# Patient Record
Sex: Male | Born: 1970 | Race: White | Hispanic: No | Marital: Married | Smoking: Former smoker
Health system: Southern US, Community
[De-identification: ages and names within clinical notes are randomized; demographics above are authoritative.]

## PROBLEM LIST (undated history)

## (undated) DIAGNOSIS — R41840 Attention and concentration deficit: Secondary | ICD-10-CM

## (undated) DIAGNOSIS — F32A Depression, unspecified: Secondary | ICD-10-CM

## (undated) DIAGNOSIS — J45909 Unspecified asthma, uncomplicated: Secondary | ICD-10-CM

## (undated) DIAGNOSIS — K219 Gastro-esophageal reflux disease without esophagitis: Secondary | ICD-10-CM

## (undated) DIAGNOSIS — I10 Essential (primary) hypertension: Secondary | ICD-10-CM

## (undated) DIAGNOSIS — I219 Acute myocardial infarction, unspecified: Secondary | ICD-10-CM

## (undated) DIAGNOSIS — J439 Emphysema, unspecified: Secondary | ICD-10-CM

## (undated) DIAGNOSIS — I2510 Atherosclerotic heart disease of native coronary artery without angina pectoris: Secondary | ICD-10-CM

## (undated) DIAGNOSIS — F419 Anxiety disorder, unspecified: Secondary | ICD-10-CM

## (undated) DIAGNOSIS — T7840XA Allergy, unspecified, initial encounter: Secondary | ICD-10-CM

## (undated) DIAGNOSIS — E785 Hyperlipidemia, unspecified: Secondary | ICD-10-CM

## (undated) DIAGNOSIS — F1020 Alcohol dependence, uncomplicated: Secondary | ICD-10-CM

## (undated) DIAGNOSIS — I509 Heart failure, unspecified: Secondary | ICD-10-CM

## (undated) HISTORY — DX: Gastro-esophageal reflux disease without esophagitis: K21.9

## (undated) HISTORY — DX: Alcohol dependence, uncomplicated: F10.20

## (undated) HISTORY — DX: Acute myocardial infarction, unspecified: I21.9

## (undated) HISTORY — DX: Allergy, unspecified, initial encounter: T78.40XA

## (undated) HISTORY — DX: Anxiety disorder, unspecified: F41.9

## (undated) HISTORY — DX: Heart failure, unspecified: I50.9

## (undated) HISTORY — PX: CARDIAC CATHETERIZATION: SHX172

## (undated) HISTORY — DX: Emphysema, unspecified: J43.9

## (undated) HISTORY — DX: Attention and concentration deficit: R41.840

## (undated) HISTORY — DX: Depression, unspecified: F32.A

## (undated) HISTORY — PX: REPLANTATION THUMB: SUR1233

## (undated) HISTORY — DX: Unspecified asthma, uncomplicated: J45.909

## (undated) HISTORY — PX: WISDOM TOOTH EXTRACTION: SHX21

## (undated) SURGERY — Surgical Case
Anesthesia: *Unknown

---

## 2007-01-21 ENCOUNTER — Emergency Department (HOSPITAL_COMMUNITY): Admission: EM | Admit: 2007-01-21 | Discharge: 2007-01-21 | Payer: Self-pay | Admitting: Emergency Medicine

## 2010-09-23 HISTORY — PX: CORONARY STENT PLACEMENT: SHX1402

## 2010-10-11 ENCOUNTER — Inpatient Hospital Stay (HOSPITAL_COMMUNITY)
Admission: EM | Admit: 2010-10-11 | Discharge: 2010-10-14 | DRG: 249 | Disposition: A | Discharge status: Home / Self Care | Payer: 59 | Attending: Cardiovascular Disease | Admitting: Cardiovascular Disease

## 2010-10-11 ENCOUNTER — Emergency Department (HOSPITAL_COMMUNITY): Payer: 59

## 2010-10-11 DIAGNOSIS — E785 Hyperlipidemia, unspecified: Secondary | ICD-10-CM | POA: Diagnosis present

## 2010-10-11 DIAGNOSIS — F17200 Nicotine dependence, unspecified, uncomplicated: Secondary | ICD-10-CM | POA: Diagnosis present

## 2010-10-11 DIAGNOSIS — J45909 Unspecified asthma, uncomplicated: Secondary | ICD-10-CM | POA: Diagnosis present

## 2010-10-11 DIAGNOSIS — I214 Non-ST elevation (NSTEMI) myocardial infarction: Principal | ICD-10-CM | POA: Diagnosis present

## 2010-10-11 DIAGNOSIS — I2510 Atherosclerotic heart disease of native coronary artery without angina pectoris: Secondary | ICD-10-CM | POA: Diagnosis present

## 2010-10-11 LAB — CBC
HCT: 49.1 % (ref 39.0–52.0)
Hemoglobin: 17.5 g/dL — ABNORMAL HIGH (ref 13.0–17.0)
MCH: 33.1 pg (ref 26.0–34.0)
MCHC: 35.6 g/dL (ref 30.0–36.0)
RDW: 13.5 % (ref 11.5–15.5)

## 2010-10-11 LAB — DIFFERENTIAL
Basophils Relative: 0 % (ref 0–1)
Eosinophils Relative: 2 % (ref 0–5)
Monocytes Absolute: 1.1 10*3/uL — ABNORMAL HIGH (ref 0.1–1.0)
Monocytes Relative: 12 % (ref 3–12)
Neutro Abs: 5.3 10*3/uL (ref 1.7–7.7)

## 2010-10-11 LAB — COMPREHENSIVE METABOLIC PANEL
BUN: 9 mg/dL (ref 6–23)
Calcium: 9.9 mg/dL (ref 8.4–10.5)
GFR calc Af Amer: >60 mL/min (ref >60)
Glucose, Bld: 95 mg/dL (ref 70–99)
Sodium: 135 mEq/L (ref 135–145)
Total Protein: 7.5 g/dL (ref 6.0–8.3)

## 2010-10-11 LAB — TROPONIN I: Troponin I: <0.3 ng/mL (ref <0.30)

## 2010-10-11 LAB — CK TOTAL AND CKMB (NOT AT ARMC)
Relative Index: 2.6 — ABNORMAL HIGH (ref 0.0–2.5)
Total CK: 178 U/L (ref 7–232)

## 2010-10-12 DIAGNOSIS — I219 Acute myocardial infarction, unspecified: Secondary | ICD-10-CM

## 2010-10-12 HISTORY — DX: Acute myocardial infarction, unspecified: I21.9

## 2010-10-12 LAB — HEMOGLOBIN A1C: Hgb A1c MFr Bld: 5.9 % — ABNORMAL HIGH (ref <5.7)

## 2010-10-12 LAB — LIPID PANEL
Cholesterol: 229 mg/dL — ABNORMAL HIGH (ref 0–200)
Total CHOL/HDL Ratio: 5.1 RATIO
VLDL: 29 mg/dL (ref 0–40)

## 2010-10-12 LAB — MAGNESIUM: Magnesium: 2.2 mg/dL (ref 1.5–2.5)

## 2010-10-12 LAB — BASIC METABOLIC PANEL
Calcium: 9.6 mg/dL (ref 8.4–10.5)
GFR calc non Af Amer: >60 mL/min (ref >60)
Glucose, Bld: 88 mg/dL (ref 70–99)
Sodium: 137 mEq/L (ref 135–145)

## 2010-10-12 LAB — CARDIAC PANEL(CRET KIN+CKTOT+MB+TROPI): Total CK: 241 U/L — ABNORMAL HIGH (ref 7–232)

## 2010-10-12 LAB — CBC
MCH: 32.8 pg (ref 26.0–34.0)
MCHC: 35.5 g/dL (ref 30.0–36.0)
Platelets: 242 10*3/uL (ref 150–400)
RDW: 13.5 % (ref 11.5–15.5)

## 2010-10-12 LAB — PRO B NATRIURETIC PEPTIDE: Pro B Natriuretic peptide (BNP): 176.8 pg/mL — ABNORMAL HIGH (ref 0–125)

## 2010-10-12 LAB — PROTIME-INR: Prothrombin Time: 12.9 seconds (ref 11.6–15.2)

## 2010-10-12 LAB — MRSA PCR SCREENING: MRSA by PCR: NEGATIVE

## 2010-10-12 LAB — RAPID URINE DRUG SCREEN, HOSP PERFORMED: Amphetamines: NOT DETECTED

## 2010-10-13 LAB — CARDIAC PANEL(CRET KIN+CKTOT+MB+TROPI)
CK, MB: 15.7 ng/mL (ref 0.3–4.0)
Relative Index: 6.8 — ABNORMAL HIGH (ref 0.0–2.5)
Relative Index: 7.6 — ABNORMAL HIGH (ref 0.0–2.5)
Total CK: 223 U/L (ref 7–232)
Troponin I: 1.81 ng/mL (ref <0.30)
Troponin I: 2.62 ng/mL (ref <0.30)

## 2010-10-13 LAB — BASIC METABOLIC PANEL
Chloride: 103 mEq/L (ref 96–112)
GFR calc Af Amer: >60 mL/min (ref >60)
Potassium: 3.9 mEq/L (ref 3.5–5.1)
Sodium: 139 mEq/L (ref 135–145)

## 2010-10-13 LAB — CBC
HCT: 45.5 % (ref 39.0–52.0)
Hemoglobin: 16.1 g/dL (ref 13.0–17.0)
MCH: 32.8 pg (ref 26.0–34.0)
MCHC: 35.4 g/dL (ref 30.0–36.0)
MCV: 92.7 fL (ref 78.0–100.0)

## 2010-10-13 LAB — URINE CULTURE: Culture: NO GROWTH

## 2010-10-18 NOTE — Cardiovascular Report (Signed)
NAMEANTWION, CARPENTER NO.:  [id number]  MEDICAL RECORD NO.:  [id number]  LOCATION:  6523                         FACILITY:  [facility]  PHYSICIAN:  Nanetta Batty, M.D.   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  He has positive risk factors and several weeks of chest pain, worst last night.  He was admitted by our PA, Donell Sievert at midnight with unstable angina.  He ruled in for myocardial infarction with a troponin that went to 0.77.  He had minimal EKG changes and was placed on IV heparin and oral nitrate.  He presents now for diagnostic coronary arteriography to define his anatomy and rule out ischemic etiology.  DESCRIPTION OF PROCEDURE:  The patient was brought to the second floor [facility] in the postabsorptive state.  He was premedicated with p.o. Valium, IV Versed and fentanyl.  His right wrist was prepped and shaved in the usual sterile fashion.  Xylocaine 1% was used for local anesthesia.  A 5-French sheath was inserted into the right radial artery using standard back wall pullback technique.  A 5- [place] TIG4 catheter along with a 5-French pigtail catheter were used for selective cholangiography and left ventriculography respectively. Visipaque dye was used to the entirety of the case.  Retrograde aortic, ventricular pullback pressures were recorded.  HEMODYNAMIC RESULTS: 1. Aortic systolic pressure 130, end-diastolic pressure 94. 2. Left ventricular systolic pressure 131, end-diastolic pressure 14.     Total of 10 mL of radial cocktail was administered via side-arm     sheath.  The patient did receive 5000 units of heparin     intravenously prior to placement of catheters within the radial     artery.  ANGIOGRAPHIC RESULTS: 1. Left main  normal. 2. LAD; LAD had a 50-60% segmental tubular stenosis in the proximal     third. 3. Circumflex; this is a culprit lesion with a 95% ruptured plaque in     the proximal AV groove circumflex just before the takeoff of the     first marginal branch and continuation of the AV groove. 4. Right coronary artery; dominant with a fairly focal 50-60% apple-     core lesion in the proximal third after the first bend. 5. Ventriculography; RAO left ventriculogram was performed using 25 mL     of Visipaque dye at 12 mL per second.  The overall LVEF is     estimated at greater than 60% without focal wall motion     abnormalities.  IMPRESSION:  Angel Gutierrez has a non-ST-elevation myocardial infarction high-grade proximal atrioventricular groove circumflex and moderate residual disease in the left anterior descending and right coronary artery with normal left ventricular function.  Because his job placed him at risk of injury, I have elected to put him in a bare-metal stent. He will be treated with aspirin, Effient, and Angiomax.  DESCRIPTION OF PROCEDURE:  The 5-French sheath was exchanged over a short wire for a 6-French sheath.  The patient received Angiomax  bolus with an ACT of 661.  Total of 155 mL of contrast was used.  Retrograde aortic pressure was monitored during the case.  The patient received 200 mcg of intracoronary nitroglycerin twice during the case.  Using a 6-French XB 3.5 guide catheter along with  0.014 x 95 Asahi soft wire and 2.0 x 12 emerge angioplasty balloon.  Predilatation was performed at nominal pressures.  Following this, a 2.5 x 23 Mini Vision stent was then deployed at 14 atmospheres and postdilated with a 2.75 x 20 [place] track at 16 atmospheres (2.83 mm) resulting reduction of 95% stenosis to 0% residual.  There was initially slow flow down the AV groove, which was a small vessel beyond the OM takeoff, but this gradually improved with intracoronary nitroglycerin.  The  patient tolerated the procedure well without hemodynamic or electrocardiographic sequelae.  IMPRESSION:  Successful percutaneous coronary intervention and stenting of proximal atrioventricular groove circumflex in the setting of non-ST- elevation myocardial infarction with moderate residual disease in the left anterior descending and right coronary artery.  The patient will be treated aspirin, Effient, beta-blocker, ACE inhibitor, statin drug. Cardiac risk factor modification will be pursued including smoking cessation.  The patient will be transferred from 2300 to 6500.  The sheath was removed and a TR band was placed on the wrist to achieve patent hemostasis with 9 mL of air.  He left the lab in stable condition.     Nanetta Batty, M.D.     Cordelia Pen  D:  10/12/2010  T:  10/13/2010  Job:  [id number]  Electronically Signed by Nanetta Batty M.D. on 10/18/2010 09:24:08 AM

## 2010-11-16 NOTE — Discharge Summary (Signed)
  Angel Gutierrez, Angel Gutierrez NO.:  [id number]  MEDICAL RECORD NO.:  [id number]  LOCATION:  2014                         FACILITY:  [facility]  PHYSICIAN:  Nanetta Batty, M.D.   DATE OF BIRTH:  Aug 26, 1970  DATE OF ADMISSION:  10/11/2010 DATE OF DISCHARGE:  10/14/2010                              DISCHARGE SUMMARY   DISCHARGE DIAGNOSES: 1. Non-ST-elevation myocardial infarction this admission with a     peak CK of 241 and MB of 15.5. 2. Coronary artery disease with circumflex multi-link Vision     stenting this admission with residual 50%-60% RCA and 60% LAD     stenosis, treated medically for now. 3. History of smoking, counseled. 4. Dyslipidemia, treated. 5. Family history of coronary artery disease.  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  He presented to [facility] ER with nonexertional sharp sternal chest pain.  He did get diaphoretic and nauseated and had shortness of breath with it.  He was noted to have some ST depression on his EKG and his troponins were positive.  He was started on heparin and nitrates, aspirin, beta-blocker, and a statin.  He was taken to the cath lab on the 20th by Dr. Allyson Sabal.  This revealed a 50%-60% RCA, 50-60% LAD, and a 95% circumflex at the bifurcation of the OM with a 60%-70% narrowing after the bifurcation, and a 60% narrowing in the OM.  He underwent stenting to the circumflex.  Dr. Allyson Sabal used a bare-metal stent because of the patient's job as a Chartered certified accountant.  He tolerated this well.  CKs peaked as noted above.  We feel he can be discharged October 14, 2010.  See med rec for complete discharge medications.  He was sent home on Effient and will need to be on this for one month.  LABS:  White count 12.3, hemoglobin 16.1, hematocrit 45.5, platelets 245.  Sodium 139, potassium 3.9, BUN 9, creatinine 0.95, hemoglobin A1c is 5.9.  CKs peaked at 241, MB 15.5, and troponin of 2.27.  Cholesterol is 229,  triglycerides 146, HDL 45, LDL 155.  TSH 2.90.  Drug screen was negative.  Urine culture was negative.  X-ray revealed no active cardiopulmonary abnormalities.  EKG shows sinus rhythm without acute changes.  DISPOSITION:  The patient is discharged in stable condition.  He has been instructed to limit his activity for a week and then he will see Dr. Allyson Sabal back in follow-up.  He will need Effient for 1 month.     Abelino Derrick, P.A.   ______________________________ Nanetta Batty, M.D.   Angel Gutierrez  D:  10/14/2010  T:  10/15/2010  Job:  [id number]  Electronically Signed by Corine Shelter P.A. on 10/24/2010 11:15:41 AM Electronically Signed by Nanetta Batty M.D. on 11/16/2010 03:12:52 PM

## 2014-01-26 DIAGNOSIS — R079 Chest pain, unspecified: Secondary | ICD-10-CM | POA: Diagnosis not present

## 2014-01-26 DIAGNOSIS — Z91012 Allergy to eggs: Secondary | ICD-10-CM

## 2014-01-26 DIAGNOSIS — R200 Anesthesia of skin: Secondary | ICD-10-CM | POA: Diagnosis present

## 2014-01-26 DIAGNOSIS — I10 Essential (primary) hypertension: Secondary | ICD-10-CM | POA: Diagnosis present

## 2014-01-26 DIAGNOSIS — I25110 Atherosclerotic heart disease of native coronary artery with unstable angina pectoris: Secondary | ICD-10-CM | POA: Diagnosis not present

## 2014-01-26 DIAGNOSIS — Z87891 Personal history of nicotine dependence: Secondary | ICD-10-CM

## 2014-01-26 DIAGNOSIS — J45909 Unspecified asthma, uncomplicated: Secondary | ICD-10-CM | POA: Diagnosis present

## 2014-01-26 DIAGNOSIS — I252 Old myocardial infarction: Secondary | ICD-10-CM

## 2014-01-26 DIAGNOSIS — E785 Hyperlipidemia, unspecified: Secondary | ICD-10-CM | POA: Diagnosis present

## 2014-01-27 ENCOUNTER — Emergency Department (HOSPITAL_COMMUNITY): Payer: BC Managed Care – PPO

## 2014-01-27 ENCOUNTER — Encounter (HOSPITAL_COMMUNITY): Payer: Self-pay | Admitting: Emergency Medicine

## 2014-01-27 ENCOUNTER — Encounter (HOSPITAL_COMMUNITY)
Admission: EM | Disposition: A | Discharge status: Home / Self Care | Payer: 59 | Source: Home / Self Care | Attending: Internal Medicine

## 2014-01-27 ENCOUNTER — Inpatient Hospital Stay (HOSPITAL_COMMUNITY)
Admission: EM | Admit: 2014-01-27 | Discharge: 2014-01-28 | DRG: 247 | Disposition: A | Discharge status: Home / Self Care | Payer: BC Managed Care – PPO | Attending: Internal Medicine | Admitting: Internal Medicine

## 2014-01-27 ENCOUNTER — Other Ambulatory Visit: Payer: Self-pay

## 2014-01-27 DIAGNOSIS — I10 Essential (primary) hypertension: Secondary | ICD-10-CM | POA: Diagnosis present

## 2014-01-27 DIAGNOSIS — R079 Chest pain, unspecified: Secondary | ICD-10-CM

## 2014-01-27 DIAGNOSIS — Z91012 Allergy to eggs: Secondary | ICD-10-CM | POA: Diagnosis not present

## 2014-01-27 DIAGNOSIS — I2583 Coronary atherosclerosis due to lipid rich plaque: Secondary | ICD-10-CM

## 2014-01-27 DIAGNOSIS — Z87891 Personal history of nicotine dependence: Secondary | ICD-10-CM | POA: Diagnosis not present

## 2014-01-27 DIAGNOSIS — E785 Hyperlipidemia, unspecified: Secondary | ICD-10-CM | POA: Diagnosis present

## 2014-01-27 DIAGNOSIS — I200 Unstable angina: Secondary | ICD-10-CM

## 2014-01-27 DIAGNOSIS — I252 Old myocardial infarction: Secondary | ICD-10-CM | POA: Diagnosis not present

## 2014-01-27 DIAGNOSIS — I25110 Atherosclerotic heart disease of native coronary artery with unstable angina pectoris: Secondary | ICD-10-CM

## 2014-01-27 DIAGNOSIS — J45909 Unspecified asthma, uncomplicated: Secondary | ICD-10-CM | POA: Diagnosis present

## 2014-01-27 DIAGNOSIS — I214 Non-ST elevation (NSTEMI) myocardial infarction: Secondary | ICD-10-CM

## 2014-01-27 DIAGNOSIS — I2510 Atherosclerotic heart disease of native coronary artery without angina pectoris: Secondary | ICD-10-CM | POA: Diagnosis present

## 2014-01-27 DIAGNOSIS — R200 Anesthesia of skin: Secondary | ICD-10-CM | POA: Diagnosis present

## 2014-01-27 DIAGNOSIS — Z955 Presence of coronary angioplasty implant and graft: Secondary | ICD-10-CM

## 2014-01-27 HISTORY — PX: LEFT HEART CATHETERIZATION WITH CORONARY ANGIOGRAM: SHX5451

## 2014-01-27 HISTORY — DX: Essential (primary) hypertension: I10

## 2014-01-27 HISTORY — DX: Atherosclerotic heart disease of native coronary artery without angina pectoris: I25.10

## 2014-01-27 HISTORY — DX: Hyperlipidemia, unspecified: E78.5

## 2014-01-27 HISTORY — PX: PERCUTANEOUS CORONARY STENT INTERVENTION (PCI-S): SHX5485

## 2014-01-27 LAB — LIPID PANEL
CHOL/HDL RATIO: 3 ratio
Cholesterol: 159 mg/dL (ref 0–200)
HDL: 53 mg/dL (ref >39)
LDL Cholesterol: 95 mg/dL (ref 0–99)
Triglycerides: 56 mg/dL (ref <150)
VLDL: 11 mg/dL (ref 0–40)

## 2014-01-27 LAB — CBC
HCT: 45 % (ref 39.0–52.0)
Hemoglobin: 15.9 g/dL (ref 13.0–17.0)
MCH: 32.4 pg (ref 26.0–34.0)
MCHC: 35.3 g/dL (ref 30.0–36.0)
MCV: 91.8 fL (ref 78.0–100.0)
Platelets: 239 10*3/uL (ref 150–400)
RBC: 4.9 MIL/uL (ref 4.22–5.81)
RDW: 13.2 % (ref 11.5–15.5)
WBC: 11.8 10*3/uL — ABNORMAL HIGH (ref 4.0–10.5)

## 2014-01-27 LAB — BASIC METABOLIC PANEL
Anion gap: 13 (ref 5–15)
Anion gap: 13 (ref 5–15)
Anion gap: 14 (ref 5–15)
BUN: 17 mg/dL (ref 6–23)
BUN: 14 mg/dL (ref 6–23)
BUN: 12 mg/dL (ref 6–23)
CALCIUM: 9.7 mg/dL (ref 8.4–10.5)
CO2: 25 mEq/L (ref 19–32)
CO2: 24 mEq/L (ref 19–32)
CO2: 22 mEq/L (ref 19–32)
Calcium: 9.7 mg/dL (ref 8.4–10.5)
Calcium: 9.6 mg/dL (ref 8.4–10.5)
Chloride: 102 mEq/L (ref 96–112)
Chloride: 101 mEq/L (ref 96–112)
Chloride: 99 mEq/L (ref 96–112)
Creatinine, Ser: 1.17 mg/dL (ref 0.50–1.35)
Creatinine, Ser: 1.09 mg/dL (ref 0.50–1.35)
Creatinine, Ser: 1.02 mg/dL (ref 0.50–1.35)
GFR calc Af Amer: 87 mL/min — ABNORMAL LOW (ref >90)
GFR calc Af Amer: >90 mL/min (ref >90)
GFR calc non Af Amer: 75 mL/min — ABNORMAL LOW (ref >90)
GFR calc non Af Amer: 89 mL/min — ABNORMAL LOW (ref >90)
GFR, EST NON AFRICAN AMERICAN: 82 mL/min — AB (ref >90)
GLUCOSE: 95 mg/dL (ref 70–99)
GLUCOSE: 101 mg/dL — AB (ref 70–99)
Glucose, Bld: 98 mg/dL (ref 70–99)
POTASSIUM: 4.2 meq/L (ref 3.7–5.3)
Potassium: 4.5 mEq/L (ref 3.7–5.3)
Potassium: 4.2 mEq/L (ref 3.7–5.3)
Sodium: 140 mEq/L (ref 137–147)
Sodium: 138 mEq/L (ref 137–147)
Sodium: 135 mEq/L — ABNORMAL LOW (ref 137–147)

## 2014-01-27 LAB — MRSA PCR SCREENING: MRSA by PCR: NEGATIVE

## 2014-01-27 LAB — TROPONIN I
Troponin I: <0.3 ng/mL (ref <0.30)
Troponin I: <0.3 ng/mL (ref <0.30)

## 2014-01-27 LAB — I-STAT TROPONIN, ED: Troponin i, poc: 0 ng/mL (ref 0.00–0.08)

## 2014-01-27 LAB — POCT ACTIVATED CLOTTING TIME: Activated Clotting Time: >1000 seconds

## 2014-01-27 LAB — PRO B NATRIURETIC PEPTIDE: Pro B Natriuretic peptide (BNP): 21.6 pg/mL (ref 0–125)

## 2014-01-27 SURGERY — LEFT HEART CATHETERIZATION WITH CORONARY ANGIOGRAM
Anesthesia: LOCAL

## 2014-01-27 MED ORDER — METHYLPREDNISOLONE SODIUM SUCC 125 MG IJ SOLR
125.0000 mg | INTRAMUSCULAR | Status: AC
  Administered 2014-01-27: 125 mg via INTRAVENOUS
  Filled 2014-01-27: qty 2

## 2014-01-27 MED ORDER — VERAPAMIL HCL 2.5 MG/ML IV SOLN
INTRAVENOUS | Status: AC
  Filled 2014-01-27: qty 2

## 2014-01-27 MED ORDER — ASPIRIN 300 MG RE SUPP: 300.0000 mg | RECTAL | Status: DC

## 2014-01-27 MED ORDER — ASPIRIN 81 MG PO CHEW
324.0000 mg | CHEWABLE_TABLET | Freq: Once | ORAL | Status: AC
  Administered 2014-01-27: 324 mg via ORAL
  Filled 2014-01-27: qty 4

## 2014-01-27 MED ORDER — NITROGLYCERIN IN D5W 200-5 MCG/ML-% IV SOLN
INTRAVENOUS | Status: AC
  Filled 2014-01-27: qty 250

## 2014-01-27 MED ORDER — NITROGLYCERIN 0.4 MG SL SUBL
0.4000 mg | SUBLINGUAL_TABLET | SUBLINGUAL | Status: DC | PRN
  Administered 2014-01-27: 0.4 mg via SUBLINGUAL
  Filled 2014-01-27: qty 1

## 2014-01-27 MED ORDER — ALBUTEROL SULFATE (2.5 MG/3ML) 0.083% IN NEBU: 2.5000 mg | INHALATION_SOLUTION | Freq: Four times a day (QID) | RESPIRATORY_TRACT | Status: DC | PRN

## 2014-01-27 MED ORDER — SODIUM CHLORIDE 0.9 % IV SOLN
INTRAVENOUS | Status: DC
  Administered 2014-01-27: 07:00:00 via INTRAVENOUS

## 2014-01-27 MED ORDER — HEPARIN (PORCINE) IN NACL 2-0.9 UNIT/ML-% IJ SOLN
INTRAMUSCULAR | Status: AC
  Filled 2014-01-27: qty 1500

## 2014-01-27 MED ORDER — FENTANYL CITRATE 0.05 MG/ML IJ SOLN
INTRAMUSCULAR | Status: AC
  Filled 2014-01-27: qty 2

## 2014-01-27 MED ORDER — BISOPROLOL FUMARATE 5 MG PO TABS
2.5000 mg | ORAL_TABLET | Freq: Every day | ORAL | Status: DC
Start: 2014-01-27 — End: 2014-01-28
  Administered 2014-01-27 – 2014-01-28 (×2): 2.5 mg via ORAL
  Filled 2014-01-27 (×2): qty 0.5

## 2014-01-27 MED ORDER — ASPIRIN 81 MG PO CHEW: 324.0000 mg | CHEWABLE_TABLET | ORAL | Status: DC

## 2014-01-27 MED ORDER — PRASUGREL HCL 10 MG PO TABS
10.0000 mg | ORAL_TABLET | Freq: Every day | ORAL | Status: DC
  Administered 2014-01-28: 10 mg via ORAL
  Filled 2014-01-27: qty 1

## 2014-01-27 MED ORDER — ACETAMINOPHEN 325 MG PO TABS
650.0000 mg | ORAL_TABLET | ORAL | Status: DC | PRN
  Administered 2014-01-27: 650 mg via ORAL
  Filled 2014-01-27: qty 2

## 2014-01-27 MED ORDER — METOPROLOL TARTRATE 25 MG PO TABS: 25.0000 mg | ORAL_TABLET | Freq: Two times a day (BID) | ORAL | Status: DC

## 2014-01-27 MED ORDER — SODIUM CHLORIDE 0.9 % IJ SOLN
3.0000 mL | Freq: Two times a day (BID) | INTRAMUSCULAR | Status: DC
  Administered 2014-01-27 – 2014-01-28 (×3): 3 mL via INTRAVENOUS

## 2014-01-27 MED ORDER — ALPRAZOLAM 0.5 MG PO TABS
1.0000 mg | ORAL_TABLET | Freq: Once | ORAL | Status: AC
  Administered 2014-01-27: 1 mg via ORAL
  Filled 2014-01-27: qty 2

## 2014-01-27 MED ORDER — ALBUTEROL SULFATE HFA 108 (90 BASE) MCG/ACT IN AERS: 2.0000 | INHALATION_SPRAY | Freq: Four times a day (QID) | RESPIRATORY_TRACT | Status: DC | PRN

## 2014-01-27 MED ORDER — FAMOTIDINE 20 MG PO TABS
20.0000 mg | ORAL_TABLET | ORAL | Status: AC
Start: 2014-01-27 — End: 2014-01-27
  Administered 2014-01-27: 20 mg via ORAL
  Filled 2014-01-27: qty 1

## 2014-01-27 MED ORDER — DIPHENHYDRAMINE HCL 50 MG/ML IJ SOLN
25.0000 mg | INTRAMUSCULAR | Status: AC
  Administered 2014-01-27: 25 mg via INTRAVENOUS
  Filled 2014-01-27: qty 1

## 2014-01-27 MED ORDER — SODIUM CHLORIDE 0.9 % IV SOLN: 1.0000 mL/kg/h | INTRAVENOUS | Status: AC

## 2014-01-27 MED ORDER — ASPIRIN 81 MG PO CHEW: 81.0000 mg | CHEWABLE_TABLET | ORAL | Status: DC

## 2014-01-27 MED ORDER — PRASUGREL HCL 10 MG PO TABS
ORAL_TABLET | ORAL | Status: AC
  Filled 2014-01-27: qty 1

## 2014-01-27 MED ORDER — PREDNISONE 50 MG PO TABS
60.0000 mg | ORAL_TABLET | ORAL | Status: DC
  Filled 2014-01-27: qty 1

## 2014-01-27 MED ORDER — HEPARIN (PORCINE) IN NACL 100-0.45 UNIT/ML-% IJ SOLN
1400.0000 [IU]/h | INTRAMUSCULAR | Status: DC
  Administered 2014-01-27: 1400 [IU]/h via INTRAVENOUS
  Filled 2014-01-27 (×2): qty 250

## 2014-01-27 MED ORDER — SODIUM CHLORIDE 0.9 % IJ SOLN: 3.0000 mL | Freq: Two times a day (BID) | INTRAMUSCULAR | Status: DC

## 2014-01-27 MED ORDER — MIDAZOLAM HCL 2 MG/2ML IJ SOLN
INTRAMUSCULAR | Status: AC
  Filled 2014-01-27: qty 2

## 2014-01-27 MED ORDER — BIVALIRUDIN 250 MG IV SOLR
INTRAVENOUS | Status: AC
  Filled 2014-01-27: qty 250

## 2014-01-27 MED ORDER — NITROGLYCERIN IN D5W 200-5 MCG/ML-% IV SOLN
0.0000 ug/min | INTRAVENOUS | Status: DC
  Administered 2014-01-27: 10 ug/min via INTRAVENOUS

## 2014-01-27 MED ORDER — SODIUM CHLORIDE 0.9 % IV SOLN: 250.0000 mL | INTRAVENOUS | Status: DC | PRN

## 2014-01-27 MED ORDER — ATORVASTATIN CALCIUM 40 MG PO TABS
40.0000 mg | ORAL_TABLET | Freq: Every day | ORAL | Status: DC
  Administered 2014-01-27 – 2014-01-28 (×2): 40 mg via ORAL
  Filled 2014-01-27 (×2): qty 1

## 2014-01-27 MED ORDER — SODIUM CHLORIDE 0.9 % IJ SOLN: 3.0000 mL | INTRAMUSCULAR | Status: DC | PRN

## 2014-01-27 MED ORDER — ONDANSETRON HCL 4 MG/2ML IJ SOLN: 4.0000 mg | Freq: Four times a day (QID) | INTRAMUSCULAR | Status: DC | PRN

## 2014-01-27 MED ORDER — PREDNISONE 10 MG PO TABS
60.0000 mg | ORAL_TABLET | ORAL | Status: DC
  Filled 2014-01-27 (×2): qty 1

## 2014-01-27 MED ORDER — HEPARIN BOLUS VIA INFUSION
4000.0000 [IU] | Freq: Once | INTRAVENOUS | Status: AC
  Administered 2014-01-27: 4000 [IU] via INTRAVENOUS
  Filled 2014-01-27: qty 4000

## 2014-01-27 MED ORDER — LISINOPRIL 5 MG PO TABS
5.0000 mg | ORAL_TABLET | Freq: Every day | ORAL | Status: DC
  Administered 2014-01-27 – 2014-01-28 (×2): 5 mg via ORAL
  Filled 2014-01-27 (×2): qty 1

## 2014-01-27 MED ORDER — NITROGLYCERIN 0.4 MG SL SUBL
0.4000 mg | SUBLINGUAL_TABLET | SUBLINGUAL | Status: DC | PRN
  Filled 2014-01-27: qty 1

## 2014-01-27 MED ORDER — BUPROPION HCL ER (SR) 150 MG PO TB12
150.0000 mg | ORAL_TABLET | Freq: Two times a day (BID) | ORAL | Status: DC
  Administered 2014-01-27 – 2014-01-28 (×3): 150 mg via ORAL
  Filled 2014-01-27 (×4): qty 1

## 2014-01-27 MED ORDER — LIDOCAINE HCL (PF) 1 % IJ SOLN
INTRAMUSCULAR | Status: AC
  Filled 2014-01-27: qty 30

## 2014-01-27 MED ORDER — NITROGLYCERIN 1 MG/10 ML FOR IR/CATH LAB
INTRA_ARTERIAL | Status: AC
  Filled 2014-01-27: qty 10

## 2014-01-27 MED ORDER — ASPIRIN EC 81 MG PO TBEC
81.0000 mg | DELAYED_RELEASE_TABLET | Freq: Every day | ORAL | Status: DC
  Administered 2014-01-28: 81 mg via ORAL
  Filled 2014-01-27: qty 1

## 2014-01-27 MED ORDER — HEPARIN SODIUM (PORCINE) 1000 UNIT/ML IJ SOLN
INTRAMUSCULAR | Status: AC
  Filled 2014-01-27: qty 1

## 2014-01-27 NOTE — ED Notes (Signed)
Cardiologist at bedside.  

## 2014-01-27 NOTE — ED Notes (Addendum)
Pt states that he was having chest pain in the central chest while lying down with numbness and tingling on the left side. Pt states that he is not having any pain at this time. Pt states he took nitro x1.

## 2014-01-27 NOTE — Progress Notes (Signed)
Patient ID: Angel Gutierrez, male   DOB: Apr 24, 1971, 43 y.o.   MRN: [id number]   SUBJECTIVE: Patient is having ongoing chest pain this morning radiating to his back.  He had NTG x 2 earlier this morning with improvement in pain but it is coming back.  So far, troponin negative and ECG non-acute.  He has history of asthma but is not wheezing.   Scheduled Meds: . aspirin  81 mg Oral Pre-Cath  . [START ON 01/28/2014] aspirin EC  81 mg Oral Daily  . atorvastatin  40 mg Oral Daily  . bisoprolol  2.5 mg Oral Daily  . buPROPion  150 mg Oral BID  . diphenhydrAMINE  25 mg Intravenous Pre-Cath  . lisinopril  5 mg Oral Daily  . methylPREDNISolone (SOLU-MEDROL) injection  125 mg Intravenous Pre-Cath  . nitroGLYCERIN      . sodium chloride  3 mL Intravenous Q12H  . sodium chloride  3 mL Intravenous Q12H   Continuous Infusions: . [START ON 01/28/2014] sodium chloride 70 mL/hr at 01/27/14 0700  . heparin 1,400 Units/hr (01/27/14 0418)  . nitroGLYCERIN 10 mcg/min (01/27/14 0758)   PRN Meds:.sodium chloride, sodium chloride, acetaminophen, albuterol, nitroGLYCERIN, ondansetron (ZOFRAN) IV, sodium chloride, sodium chloride    Filed Vitals:   01/27/14 0544 01/27/14 0600 01/27/14 0700 01/27/14 0800  BP:  136/84 140/87 112/69  Pulse:  71 57   Temp:    97.9 F (36.6 C)  TempSrc:    Oral  Resp:  14 10 9   Height: 6\' 1"  (1.854 m)     Weight: 260 lb 14.4 oz (118.343 kg)     SpO2:  95% 98% 100%    Intake/Output Summary (Last 24 hours) at 01/27/14 0813 Last data filed at 01/27/14 0700  Gross per 24 hour  Intake     28 ml  Output      0 ml  Net     28 ml    LABS: Basic Metabolic Panel:  Recent Labs  01/27/14 0005 01/27/14 0625  NA 140 138  K 4.5 4.2  CL 102 101  CO2 25 24  GLUCOSE 98 95  BUN 17 14  CREATININE 1.17 1.09  CALCIUM 9.7 9.7   Liver Function Tests: No results found for this basename: AST, ALT, ALKPHOS, BILITOT, PROT, ALBUMIN,  in the last 72 hours No results found for this  basename: LIPASE, AMYLASE,  in the last 72 hours CBC:  Recent Labs  01/27/14 0005  WBC 11.8*  HGB 15.9  HCT 45.0  MCV 91.8  PLT 239   Cardiac Enzymes:  Recent Labs  01/27/14 0240  TROPONINI <0.30   BNP: No components found with this basename: POCBNP,  D-Dimer: No results found for this basename: DDIMER,  in the last 72 hours Hemoglobin A1C: No results found for this basename: HGBA1C,  in the last 72 hours Fasting Lipid Panel:  Recent Labs  01/27/14 0625  CHOL 159  HDL 53  LDLCALC 95  TRIG 56  CHOLHDL 3.0   Thyroid Function Tests: No results found for this basename: TSH, T4TOTAL, FREET3, T3FREE, THYROIDAB,  in the last 72 hours Anemia Panel: No results found for this basename: VITAMINB12, FOLATE, FERRITIN, TIBC, IRON, RETICCTPCT,  in the last 72 hours  RADIOLOGY: Dg Chest 2 View  01/27/2014   CLINICAL DATA:  Acute onset of diffuse chest pain. Current history of hypertension. Initial encounter.  EXAM: CHEST  2 VIEW  COMPARISON:  Chest radiograph from 10/11/2010  FINDINGS: The lungs are  well-aerated and clear. There is no evidence of focal opacification, pleural effusion or pneumothorax.  The heart is normal in size; the mediastinal contour is within normal limits. No acute osseous abnormalities are seen.  IMPRESSION: No acute cardiopulmonary process seen.   Electronically Signed   By: Garald Balding M.D.   On: 01/27/2014 01:50    PHYSICAL EXAM General: NAD Neck: No JVD, no thyromegaly or thyroid nodule.  Lungs: Clear to auscultation bilaterally with normal respiratory effort. CV: Nondisplaced PMI.  Heart regular S1/S2, no S3/S4, no murmur.  No peripheral edema.  No carotid bruit.  Normal pedal pulses.  Abdomen: Soft, nontender, no hepatosplenomegaly, no distention.  Neurologic: Alert and oriented x 3.  Psych: Normal affect. Extremities: No clubbing or cyanosis.   TELEMETRY: Reviewed telemetry pt in NSR  ASSESSMENT AND PLAN: 43 yo with history of CAD s/p NSTEMI  in 2012 presented with chest pain starting last night reminiscent of prior MI. Ongoing chest pain currently concerning for unstable angina.  It does seem to respond to NTG.  So far, 1 set troponin negative and ECG non-acute.  He had BMS to LCx in 2012 and had residual moderate disease in the LAD and RCA.  - Start NTG gtt - Continue ASA, statin, heparin gtt. - Add bisoprolol (beta-1 selective with asthma) and lisinopril. - Will arrange for coronary angiography as soon as possible this morning.  - Will need premedication for contrast allergy.   Loralie Champagne 01/27/2014 8:17 AM

## 2014-01-27 NOTE — ED Notes (Signed)
RN called to room. Pt reports he is starting to have severe CP again. MD notified. Tech performing repeat EKG at this time.

## 2014-01-27 NOTE — Interval H&P Note (Signed)
History and Physical Interval Note:  01/27/2014 8:56 AM  Angel Gutierrez  has presented today for surgery, with the diagnosis of cp  The various methods of treatment have been discussed with the patient and family. After consideration of risks, benefits and other options for treatment, the patient has consented to  Procedure(s): LEFT HEART CATHETERIZATION WITH CORONARY ANGIOGRAM (N/A) as a surgical intervention .  The patient's history has been reviewed, patient examined, no change in status, stable for surgery.  I have reviewed the patient's chart and labs.  Questions were answered to the patient's satisfaction.   Cath Lab Visit (complete for each Cath Lab visit)  Clinical Evaluation Leading to the Procedure:   ACS: Yes.    Non-ACS:    Anginal Classification: CCS IV  Anti-ischemic medical therapy: Maximal Therapy (2 or more classes of medications)  Non-Invasive Test Results: No non-invasive testing performed  Prior CABG: No previous CABG        Collier [facility]ina [facility] 01/27/2014 8:56 AM

## 2014-01-27 NOTE — Progress Notes (Signed)
MD notified of pt increasing CP, EKG done and nitro gtt started. Pt says pain is a little better with nitro gtt but still present. Nitro titrated up and pt with pain relief. Precath meds given and heparin stopped with call to cath lab. Pt transported safely and wife notified.

## 2014-01-27 NOTE — CV Procedure (Signed)
    Cardiac Catheterization Procedure Note  Name: Angel Gutierrez MRN: [id number] DOB: 1970/10/15  Procedure: Left Heart Cath, Selective Coronary Angiography, LV angiography, PTCA and stenting of the mid RCA  Indication: 43 yo WM with history of CAD s/p stenting of the LCx in 2012 with a BMS presents with USAP.  Procedural Details:  The right wrist was prepped, draped, and anesthetized with 1% lidocaine. Using the modified Seldinger technique, a 6 French slender sheath was introduced into the right radial artery. 3 mg of verapamil was administered through the sheath, weight-based unfractionated heparin was administered intravenously. Standard Judkins catheters were used for selective coronary angiography and left ventriculography. Catheter exchanges were performed over an exchange length guidewire.  PROCEDURAL FINDINGS Hemodynamics: AO 109/74 mean 91 mm Hg LV 113/6 mm Hg   Coronary angiography: Coronary dominance: right  Left mainstem: Normal.  Left anterior descending (LAD): Diffuse disease in the proximal vessel up to 40%. There is mild calcification.  Left circumflex (LCx): The LCx gives rise to a single large OM then terminates in a small branch on the lateral wall. The stent in the proximal LCx extending into OM1 is widely patent. The ostium of the terminal LCx branch has a 90% focal stenosis (jailed by stent).  Right coronary artery (RCA): There is an 80% stenosis in the mid vessel. The RCA is a large dominant vessel.  Left ventriculography: Left ventricular systolic function is normal, LVEF is estimated at 55-65%, there is no significant mitral regurgitation   PCI Note:  Following the diagnostic procedure, the decision was made to proceed with PCI of the RCA which has progressed since 2012. The distal LCx appears similar to post stent appearance in 2012. Effient 60 mg was given orally.  Weight-based bivalirudin was given for anticoagulation. Once a therapeutic ACT was achieved, a 6  [place] FR4 guide catheter was inserted.  A prowater coronary guidewire was used to cross the lesion.  The lesion was predilated with a 2.5 mm balloon.  The lesion was then stented with a 3.5 x 16 mm Promus  stent.  The stent was postdilated with a 3.75 mm noncompliant balloon.  Following PCI, there was 0% residual stenosis and TIMI-3 flow. Final angiography confirmed an excellent result. The patient tolerated the procedure well. There were no immediate procedural complications. A TR band was used for radial hemostasis. The patient was transferred to the post catheterization recovery area for further monitoring.  PCI Data: Vessel - RCA/Segment - mid Percent Stenosis (pre)  80% TIMI-flow 3 Stent 3.5 x 16 mm Promus Percent Stenosis (post) 0% TIMI-flow (post) 3  Final Conclusions:   1. 2 vessel obstructive CAD. Progressive disease in the mid RCA. Patent stent in the LCx/OM1. 2. Normal LV function. 3. Successful stenting of the mid RCA with DES.   Recommendations:  DAPT for one year. Since the distal LCx was similar in appearance to 2012 and is a small branch this was not treated with PCI. If he has refractory chest pain may consider PTCA of this lesion but I think he will do well on medical therapy.  Angel Gutierrez, [place] 01/27/2014, 9:58 AM

## 2014-01-27 NOTE — ED Provider Notes (Signed)
CSN: [id number]     Arrival date & time 01/26/14  2354 History   First MD Initiated Contact with Patient 01/27/14 0013     Chief Complaint  Patient presents with  . Chest Pain     (Consider location/radiation/quality/duration/timing/severity/associated sxs/prior Treatment) HPI   43 year old male with chest pain. Symptom onset this evening around 10 PM. Patient describes a pressure in the center of his chest. Associated with some mild nausea. Triage note reviewed, but patient denies shortness of breath or diaphoresis. Perhaps some mild numbness in his left hand. Symptoms waxed and waned over the course of about an hour. No appreciable exacerbating factors. The pain was relieved with nitroglycerin fairly quickly after taking it. No unusual leg pain or swelling. No cough. No fever or chills. Was in his usual state of health earlier today.  Hx of coronary artery disease with circumflex stenting 09/2010 with residual 50%-60% RCA and 60% LAD stenosis, treated medically. Pt not sure of current symptoms similar to those he was having at that time.    Past Medical History  Diagnosis Date  . Coronary artery disease   . Hypertension    Past Surgical History  Procedure Laterality Date  . Coronary stent placement     No family history on file. History  Substance Use Topics  . Smoking status: Never Smoker   . Smokeless tobacco: Not on file  . Alcohol Use: Yes    Review of Systems  All systems reviewed and negative, other than as noted in HPI.   Allergies  Review of patient's allergies indicates no known allergies.  Home Medications   Prior to Admission medications   Not on File   BP 128/87  Pulse 58  Temp(Src) 98.5 F (36.9 C) (Oral)  Resp 16  Ht 6\' 1"  (1.854 m)  Wt 260 lb (117.935 kg)  BMI 34.31 kg/m2  SpO2 99% Physical Exam  Nursing note and vitals reviewed. Constitutional: He appears well-developed and well-nourished. No distress.  HENT:  Head: Normocephalic and  atraumatic.  Eyes: Conjunctivae are normal. Right eye exhibits no discharge. Left eye exhibits no discharge.  Neck: Neck supple.  Cardiovascular: Normal rate, regular rhythm and normal heart sounds.  Exam reveals no gallop and no friction rub.   No murmur heard. Pulmonary/Chest: Effort normal and breath sounds normal. No respiratory distress. He exhibits no tenderness.  Abdominal: Soft. He exhibits no distension. There is no tenderness.  Musculoskeletal: He exhibits no edema and no tenderness.  Lower extremities symmetric as compared to each other. No calf tenderness. Negative Homan's. No palpable cords.   Neurological: He is alert.  Skin: Skin is warm and dry.  Psychiatric: He has a normal mood and affect. His behavior is normal. Thought content normal.    ED Course  Procedures (including critical care time)  CRITICAL CARE Performed by: Virgel Manifold  Total critical care time: 35 minutes  Critical care time was exclusive of separately billable procedures and treating other patients. Critical care was necessary to treat or prevent imminent or life-threatening deterioration. Critical care was time spent personally by me on the following activities: development of treatment plan with patient and/or surrogate as well as nursing, discussions with consultants, evaluation of patient's response to treatment, examination of patient, obtaining history from patient or surrogate, ordering and performing treatments and interventions, ordering and review of laboratory studies, ordering and review of radiographic studies, pulse oximetry and re-evaluation of patient's condition.  Labs Review Labs Reviewed  CBC - Abnormal; Notable for the  following:    WBC 11.8 (*)    All other components within normal limits  BASIC METABOLIC PANEL - Abnormal; Notable for the following:    GFR calc non Af Amer 75 (*)    GFR calc Af Amer 87 (*)    All other components within normal limits  PRO B NATRIURETIC  PEPTIDE  TROPONIN I  I-STAT TROPOININ, ED    Imaging Review No results found.   EKG Interpretation   Date/Time:  Monday January 26 2014 23:58:02 EDT Ventricular Rate:  58 PR Interval:  152 QRS Duration: 90 QT Interval:  394 QTC Calculation: 386 R Axis:   76 Text Interpretation:  Sinus bradycardia Otherwise normal ECG Confirmed by  Wilson Singer  MD, Jordin Dambrosio ([id number]) on 01/27/2014 12:15:01 AM      MDM   Final diagnoses:  Chest pain, unspecified chest pain type  Unstable Angina  0338 Second troponin came back normal, but pt again now having same CP, numbness in ulnar aspect of L hand and some nausea.  Repeat EKG. Now concerned this may be unstable angina. Will discuss with cardiology    Virgel Manifold, MD 01/29/14 2250

## 2014-01-27 NOTE — Progress Notes (Signed)
ANTICOAGULATION CONSULT NOTE - Initial Consult  Pharmacy Consult for Heparin Indication: chest pain/ACS  No Known Allergies  Patient Measurements: Height: 6\' 1"  (185.4 cm) Weight: 260 lb (117.935 kg) IBW/kg (Calculated) : 79.9 Heparin Dosing Weight: 105 kg  Vital Signs: Temp: 98.5 F (36.9 C) (10/05 2359) Temp Source: Oral (10/05 2359) BP: 114/77 mmHg (10/06 0315) Pulse Rate: 56 (10/06 0315)  Labs:  Recent Labs  01/27/14 0005 01/27/14 0240  HGB 15.9  --   HCT 45.0  --   PLT 239  --   CREATININE 1.17  --   TROPONINI  --  <0.30    Estimated Creatinine Clearance: 110.6 ml/min (by C-G formula based on Cr of 1.17).   Medical History: Past Medical History  Diagnosis Date  . Coronary artery disease   . Hypertension     Medications:  Albuterol  Lipitor  Wellbutrin  Zestril  Lopressor  MVI  Ntg  Assessment: 43 y.o. male with chest pain for heparin   Goal of Therapy:  Heparin level 0.3-0.7 units/ml Monitor platelets by anticoagulation protocol: Yes   Plan:  Heparin 4000 units IV bolus, then start heparin 1400 units/hr Check heparin level in 6 hours.   Davina Howlett, [place] Curb 01/27/2014,3:44 AM

## 2014-01-27 NOTE — H&P (View-Only) (Signed)
Patient ID: Angel Gutierrez, male   DOB: [birth year], 43 y.o.   MRN: [id number]   SUBJECTIVE: Patient is having ongoing chest pain this morning radiating to his back.  He had NTG x 2 earlier this morning with improvement in pain but it is coming back.  So far, troponin negative and ECG non-acute.  He has history of asthma but is not wheezing.   Scheduled Meds: . aspirin  81 mg Oral Pre-Cath  . [START ON 01/28/2014] aspirin EC  81 mg Oral Daily  . atorvastatin  40 mg Oral Daily  . bisoprolol  2.5 mg Oral Daily  . buPROPion  150 mg Oral BID  . diphenhydrAMINE  25 mg Intravenous Pre-Cath  . lisinopril  5 mg Oral Daily  . methylPREDNISolone (SOLU-MEDROL) injection  125 mg Intravenous Pre-Cath  . nitroGLYCERIN      . sodium chloride  3 mL Intravenous Q12H  . sodium chloride  3 mL Intravenous Q12H   Continuous Infusions: . [START ON 01/28/2014] sodium chloride 70 mL/hr at 01/27/14 0700  . heparin 1,400 Units/hr (01/27/14 0418)  . nitroGLYCERIN 10 mcg/min (01/27/14 0758)   PRN Meds:.sodium chloride, sodium chloride, acetaminophen, albuterol, nitroGLYCERIN, ondansetron (ZOFRAN) IV, sodium chloride, sodium chloride    Filed Vitals:   01/27/14 0544 01/27/14 0600 01/27/14 0700 01/27/14 0800  BP:  136/84 140/87 112/69  Pulse:  71 57   Temp:    97.9 F (36.6 C)  TempSrc:    Oral  Resp:  14 10 9   Height: 6\' 1"  (1.854 m)     Weight: 260 lb 14.4 oz (118.343 kg)     SpO2:  95% 98% 100%    Intake/Output Summary (Last 24 hours) at 01/27/14 0813 Last data filed at 01/27/14 0700  Gross per 24 hour  Intake     28 ml  Output      0 ml  Net     28 ml    LABS: Basic Metabolic Panel:  Recent Labs  01/27/14 0005 01/27/14 0625  NA 140 138  K 4.5 4.2  CL 102 101  CO2 25 24  GLUCOSE 98 95  BUN 17 14  CREATININE 1.17 1.09  CALCIUM 9.7 9.7   Liver Function Tests: No results found for this basename: AST, ALT, ALKPHOS, BILITOT, PROT, ALBUMIN,  in the last 72 hours No results found for this  basename: LIPASE, AMYLASE,  in the last 72 hours CBC:  Recent Labs  01/27/14 0005  WBC 11.8*  HGB 15.9  HCT 45.0  MCV 91.8  PLT 239   Cardiac Enzymes:  Recent Labs  01/27/14 0240  TROPONINI <0.30   BNP: No components found with this basename: POCBNP,  D-Dimer: No results found for this basename: DDIMER,  in the last 72 hours Hemoglobin A1C: No results found for this basename: HGBA1C,  in the last 72 hours Fasting Lipid Panel:  Recent Labs  01/27/14 0625  CHOL 159  HDL 53  LDLCALC 95  TRIG 56  CHOLHDL 3.0   Thyroid Function Tests: No results found for this basename: TSH, T4TOTAL, FREET3, T3FREE, THYROIDAB,  in the last 72 hours Anemia Panel: No results found for this basename: VITAMINB12, FOLATE, FERRITIN, TIBC, IRON, RETICCTPCT,  in the last 72 hours  RADIOLOGY: Dg Chest 2 View  01/27/2014   CLINICAL DATA:  Acute onset of diffuse chest pain. Current history of hypertension. Initial encounter.  EXAM: CHEST  2 VIEW  COMPARISON:  Chest radiograph from 10/11/2010  FINDINGS: The lungs are  well-aerated and clear. There is no evidence of focal opacification, pleural effusion or pneumothorax.  The heart is normal in size; the mediastinal contour is within normal limits. No acute osseous abnormalities are seen.  IMPRESSION: No acute cardiopulmonary process seen.   Electronically Signed   By: Garald Balding M.D.   On: 01/27/2014 01:50    PHYSICAL EXAM General: NAD Neck: No JVD, no thyromegaly or thyroid nodule.  Lungs: Clear to auscultation bilaterally with normal respiratory effort. CV: Nondisplaced PMI.  Heart regular S1/S2, no S3/S4, no murmur.  No peripheral edema.  No carotid bruit.  Normal pedal pulses.  Abdomen: Soft, nontender, no hepatosplenomegaly, no distention.  Neurologic: Alert and oriented x 3.  Psych: Normal affect. Extremities: No clubbing or cyanosis.   TELEMETRY: Reviewed telemetry pt in NSR  ASSESSMENT AND PLAN: 43 yo with history of CAD s/p NSTEMI  in 2012 presented with chest pain starting last night reminiscent of prior MI. Ongoing chest pain currently concerning for unstable angina.  It does seem to respond to NTG.  So far, 1 set troponin negative and ECG non-acute.  He had BMS to LCx in 2012 and had residual moderate disease in the LAD and RCA.  - Start NTG gtt - Continue ASA, statin, heparin gtt. - Add bisoprolol (beta-1 selective with asthma) and lisinopril. - Will arrange for coronary angiography as soon as possible this morning.  - Will need premedication for contrast allergy.   Loralie Champagne 01/27/2014 8:17 AM

## 2014-01-27 NOTE — H&P (Signed)
Primary Physician: Primary Cardiologist:  Gwenlyn Found   HPI:  Patient is a 43 yo with a history of CAD  Presents to ER with CP  Patient with Hx of CAD  In 2012 presented with sharp SSCP  EKG with ST depression Suffered NSTEMI.  Cardiac cath showed a 50%-60% RCA, 50-60% LAD, and a 95%  circumflex at the bifurcation of the OM with a 60%-70% narrowing after  the bifurcation, and a 60% narrowing in the OM. He underwent  Bare metal stenting to the circumflex.  Reports that he saw Dr Gwenlyn Found a few times after  Had a stress  Test that was negative.  Since doing well f/u with primary care after.  Says that lipids are not as good as they should be.  QUit smoking at time of MI    Tonight at around 10 PM developed SSCP  Pressure.  Severe  Mild nausea.  Alos some numbness in L hand   Took NTG with relief.  Concerned  Came to ER.  While in ER developed similar pain  Easing off.  Labs drawn  Discomfort tonight was intense  At most 5/10 in intensity  Not as bad as with NSTEMi  It does remind him of symptoms that lead up to MI in 2012 (built over days)  Denies f/c  No Cough  No GERD  No vomiting or diarrhea. Had been feeling good up til tonight.         Past Medical History  Diagnosis Date  . Coronary artery disease   . Hypertension      (Not in a hospital admission)      Infusions: . heparin 1,400 Units/hr (01/27/14 0418)    No Known Allergies  History   Social History  . Marital Status: Married    Spouse Name: N/A    Number of Children: N/A  . Years of Education: N/A   Occupational History  . Not on file.   Social History Main Topics  . Smoking status: Never Smoker   . Smokeless tobacco: Not on file  . Alcohol Use: Yes  . Drug Use: No  . Sexual Activity: Not on file   Other Topics Concern  . Not on file   Social History Narrative  . No narrative on file    No family history on file.  REVIEW OF SYSTEMS:  All systems reviewed  Negative to the above problem except as  noted above.    PHYSICAL EXAM: Filed Vitals:   01/27/14 0400  BP: 130/77  Pulse: 75  Temp:   Resp: 17    No intake or output data in the 24 hours ending 01/27/14 0425  General:  Well appearing. No respiratory difficulty HEENT: normal Neck: supple. no JVD. Carotids 2+ bilat; no bruits. No lymphadenopathy or thryomegaly appreciated. Cor: PMI nondisplaced. Regular rate & rhythm. No rubs, gallops or murmurs. Lungs: clear Abdomen: soft, nontender, nondistended. No hepatosplenomegaly. No bruits or masses. Good bowel sounds. Extremities: no cyanosis, clubbing, rash, edema Neuro: alert & oriented x 3, cranial nerves grossly intact. moves all 4 extremities w/o difficulty. Affect pleasant.  ECG:  Sinus bradycardia  56 bpm    Results for orders placed during the hospital encounter of 01/27/14 (from the past 24 hour(s))  CBC     Status: Abnormal   Collection Time    01/27/14 12:05 AM      Result Value Ref Range   WBC 11.8 (*) 4.0 - 10.5 K/uL   RBC 4.90  4.22 - 5.81 MIL/uL   Hemoglobin 15.9  13.0 - 17.0 g/dL   HCT 45.0  39.0 - 52.0 %   MCV 91.8  78.0 - 100.0 fL   MCH 32.4  26.0 - 34.0 pg   MCHC 35.3  30.0 - 36.0 g/dL   RDW 13.2  11.5 - 15.5 %   Platelets 239  150 - 400 K/uL  BASIC METABOLIC PANEL     Status: Abnormal   Collection Time    01/27/14 12:05 AM      Result Value Ref Range   Sodium 140  137 - 147 mEq/L   Potassium 4.5  3.7 - 5.3 mEq/L   Chloride 102  96 - 112 mEq/L   CO2 25  19 - 32 mEq/L   Glucose, Bld 98  70 - 99 mg/dL   BUN 17  6 - 23 mg/dL   Creatinine, Ser 1.17  0.50 - 1.35 mg/dL   Calcium 9.7  8.4 - 10.5 mg/dL   GFR calc non Af Amer 75 (*) >90 mL/min   GFR calc Af Amer 87 (*) >90 mL/min   Anion gap 13  5 - 15  I-STAT TROPOININ, ED     Status: None   Collection Time    01/27/14 12:16 AM      Result Value Ref Range   Troponin i, poc 0.00  0.00 - 0.08 ng/mL   Comment 3           PRO B NATRIURETIC PEPTIDE     Status: None   Collection Time    01/27/14  2:40  AM      Result Value Ref Range   Pro B Natriuretic peptide (BNP) 21.6  0 - 125 pg/mL  TROPONIN I     Status: None   Collection Time    01/27/14  2:40 AM      Result Value Ref Range   Troponin I <0.30  <0.30 ng/mL   Dg Chest 2 View  01/27/2014   CLINICAL DATA:  Acute onset of diffuse chest pain. Current history of hypertension. Initial encounter.  EXAM: CHEST  2 VIEW  COMPARISON:  Chest radiograph from 10/11/2010  FINDINGS: The lungs are well-aerated and clear. There is no evidence of focal opacification, pleural effusion or pneumothorax.  The heart is normal in size; the mediastinal contour is within normal limits. No acute osseous abnormalities are seen.  IMPRESSION: No acute cardiopulmonary process seen.   Electronically Signed   By: Garald Balding M.D.   On: 01/27/2014 01:50     ASSESSMENT: 43 yo with known CAD  Presents tonight with CP  Occurred at rest.  EKG without acute changes.  Concerning is that he has had another episode of discomfort while in ER  Now gone. BOth spells are similar to symptoms prior to MI.  Plan to admit/  Heparin, ASA, NTG Cycle troponin Plan for L heart cath today to define.   ? Iodine Rxn  Will pretreat.  2.  HL  Check lipids  3.  HTN  Follow.

## 2014-01-27 NOTE — Care Management Note (Deleted)
    Page 1 of 1   01/27/2014     11:38:22 AM CARE MANAGEMENT NOTE 01/27/2014  Patient:  VINEETH, FELL   Account Number:  [id number]  Date Initiated:  01/27/2014  Documentation initiated by:  Elissa Hefty  Subjective/Objective Assessment:   adm w ch pain, stent placed     Action/Plan:   lives w fam   Anticipated DC Date:     Anticipated DC Plan:  [place]  CM consult  Medication Assistance      Choice offered to / List presented to:             Status of service:   Medicare Important Message given?   (If response is "NO", the following Medicare IM given date fields will be blank) Date Medicare IM given:   Medicare IM given by:   Date Additional Medicare IM given:   Additional Medicare IM given by:    Discharge Disposition:    Per UR Regulation:  Reviewed for med. necessity/level of care/duration of stay  If discussed at [place] of Stay Meetings, dates discussed:    Comments:  10/6 1137a debbie Bettyanne Dittman rn,bsn gave pt 30day free and copay assist card for brilinta.

## 2014-01-27 NOTE — ED Notes (Signed)
Pt. reports mid chest tightness with mild SOB , nausea and diaphoresis onset this evening , history of CAD and coronary stent - his cardiologist is Dr. Gwenlyn Found.

## 2014-01-27 NOTE — Care Management Note (Signed)
    Page 1 of 1   01/27/2014     11:41:00 AM CARE MANAGEMENT NOTE 01/27/2014  Patient:  ANDREE, GOLPHIN   Account Number:  [id number]  Date Initiated:  01/27/2014  Documentation initiated by:  Elissa Hefty  Subjective/Objective Assessment:   adm w ch pain, stent placed     Action/Plan:   lives w fam   Anticipated DC Date:     Anticipated DC Plan:  [place]  CM consult  Medication Assistance      Choice offered to / List presented to:             Status of service:   Medicare Important Message given?   (If response is "NO", the following Medicare IM given date fields will be blank) Date Medicare IM given:   Medicare IM given by:   Date Additional Medicare IM given:   Additional Medicare IM given by:    Discharge Disposition:    Per UR Regulation:  Reviewed for med. necessity/level of care/duration of stay  If discussed at [place] of Stay Meetings, dates discussed:    Comments:  10/6 1137a debbie Pammy Vesey rn,bsn gave pt 30day free and copay assist card for effient.

## 2014-01-28 ENCOUNTER — Encounter (HOSPITAL_COMMUNITY): Payer: Self-pay | Admitting: Physician Assistant

## 2014-01-28 DIAGNOSIS — E785 Hyperlipidemia, unspecified: Secondary | ICD-10-CM | POA: Diagnosis present

## 2014-01-28 DIAGNOSIS — I2510 Atherosclerotic heart disease of native coronary artery without angina pectoris: Secondary | ICD-10-CM

## 2014-01-28 DIAGNOSIS — I10 Essential (primary) hypertension: Secondary | ICD-10-CM | POA: Diagnosis present

## 2014-01-28 LAB — BASIC METABOLIC PANEL
Anion gap: 15 (ref 5–15)
BUN: 13 mg/dL (ref 6–23)
CO2: 23 mEq/L (ref 19–32)
CREATININE: 0.9 mg/dL (ref 0.50–1.35)
Calcium: 9.7 mg/dL (ref 8.4–10.5)
Chloride: 100 mEq/L (ref 96–112)
GFR calc Af Amer: >90 mL/min (ref >90)
Glucose, Bld: 137 mg/dL — ABNORMAL HIGH (ref 70–99)
Potassium: 4.3 mEq/L (ref 3.7–5.3)
Sodium: 138 mEq/L (ref 137–147)

## 2014-01-28 LAB — CBC
HEMATOCRIT: 44.1 % (ref 39.0–52.0)
Hemoglobin: 15.3 g/dL (ref 13.0–17.0)
MCH: 31.1 pg (ref 26.0–34.0)
MCHC: 34.7 g/dL (ref 30.0–36.0)
MCV: 89.6 fL (ref 78.0–100.0)
Platelets: 241 10*3/uL (ref 150–400)
RBC: 4.92 MIL/uL (ref 4.22–5.81)
RDW: 13.1 % (ref 11.5–15.5)
WBC: 19.2 10*3/uL — ABNORMAL HIGH (ref 4.0–10.5)

## 2014-01-28 MED ORDER — PRASUGREL HCL 10 MG PO TABS: 10.0000 mg | ORAL_TABLET | Freq: Every day | ORAL | Status: DC

## 2014-01-28 MED ORDER — LISINOPRIL 5 MG PO TABS: 5.0000 mg | ORAL_TABLET | Freq: Every day | ORAL | Status: DC

## 2014-01-28 MED ORDER — ATORVASTATIN CALCIUM 80 MG PO TABS: 80.0000 mg | ORAL_TABLET | Freq: Every day | ORAL | Status: DC

## 2014-01-28 MED ORDER — ASPIRIN 81 MG PO TBEC: 81.0000 mg | DELAYED_RELEASE_TABLET | Freq: Every day | ORAL | Status: DC

## 2014-01-28 MED ORDER — BISOPROLOL FUMARATE 5 MG PO TABS: 2.5000 mg | ORAL_TABLET | Freq: Every day | ORAL | Status: AC

## 2014-01-28 MED FILL — Sodium Chloride IV Soln 0.9%: INTRAVENOUS | Qty: 50 | Status: AC

## 2014-01-28 NOTE — Discharge Instructions (Signed)
Angina Pectoris Angina pectoris is extreme discomfort in your chest, neck, or arm. Your doctor may call it just angina. It is caused by a lack of oxygen to your heart wall. It may feel like tightness or heavy pressure. It may feel like a crushing or squeezing pain. Some people say it feels like gas. It may go down your shoulders, back, and arms. Some people have symptoms other than pain. These include:  Tiredness.  Shortness of breath.  Cold sweats.  Feeling sick to your stomach (nausea). There are four types of angina:  Stable angina. This type often lasts the same amount of time each time it happens. Activity, stress, or excitement can bring it on. It often gets better after taking a medicine called nitroglycerin. This goes under your tongue.  Unstable angina. This type can happen when you are not active or even during sleep. It can suddenly get worse or happen more often. It may not get better after taking the special medicine. It can last up to 30 minutes.  Microvascular angina. This type is more common in women. It may be more severe or last longer than other types.  Prinzmetal angina. This type often happens when you are not active or in the early morning hours. HOME CARE   Only take medicines as told by your doctor.  Stay active or exercise more as told by your doctor.  Limit very hard activity as told by your doctor.  Limit heavy lifting as told by your doctor.  Keep a healthy weight.  Learn about and eat foods that are healthy for your heart.  Do not use any tobacco such as cigarettes, chewing tobacco, or e-cigarettes. GET HELP RIGHT AWAY IF:   You have chest, neck, deep shoulder, or arm pain or discomfort that lasts more than a few minutes.  You have chest, neck, deep shoulder, or arm pain or discomfort that goes away and comes back over and over again.  You have heavy sweating that seems to happen for no reason.  You have shortness of breath or trouble  breathing.  Your angina does not get better after a few minutes of rest.  Your angina does not get better after you take nitroglycerin medicine. These can all be symptoms of a heart attack. Get help right away. Call your local emergency service (911 in [place] Do not  drive yourself to the hospital. Do not  wait to for your symptoms to go away. MAKE SURE YOU:   Understand these instructions.  Will watch your condition.  Will get help right away if you are not doing well or get worse. Document Released: 09/27/2007 Document Revised: 04/15/2013 Document Reviewed: 08/12/2013 [facility] Patient Information 2015 [place], [place]. This information is not intended to replace advice given to you by your health care provider. Make sure you discuss any questions you have with your health care provider.  No driving for 24 hours. No lifting over 5 lbs for 1 week. No sexual activity for 1 week. Keep procedure site clean & dry. If you notice increased pain, swelling, bleeding or pus, call/return!  You may shower, but no soaking baths/hot tubs/pools for 1 week.

## 2014-01-28 NOTE — Progress Notes (Signed)
Pt teaching done and AVS summary given. Pt understands where follow up apt is and says his wife will take him there. PIV removed and patient safely discharged. No complaints of Pain and vs WNL upon D/C. All questions answered by this nurse and the NP.

## 2014-01-28 NOTE — Progress Notes (Signed)
CARDIAC REHAB PHASE I   PRE:  Rate/Rhythm: 92 SR  BP:  Supine: 125/78  Sitting:   Standing:    SaO2:   MODE:  Ambulation: 620 ft   POST:  Rate/Rhythm: 102 ST  BP:  Supine:   Sitting: 142/94  Standing:    SaO2:  1005-1100 Pt walked 620 ft on RA with steady gait. No CP. Tolerated well. Education completed with pt and wife who voiced understanding. Has effient booklet and stressed importance of taking. Reviewed NTG use. Pt very interested in CRP 2 so will refer to [place] program. Reviewed heart healthy diet.   Graylon Good, RN BSN  01/28/2014 10:59 AM

## 2014-01-28 NOTE — Discharge Summary (Signed)
Discharge Summary   Patient ID: Angel Gutierrez,  MRN: [id number], DOB/AGE: 42-07-1970 42 y.o.  Admit date: 01/27/2014 Discharge date: 01/28/2014  Primary Care Provider: PROVIDER NOT IN SYSTEM Primary Cardiologist: Dr. Gwenlyn Found  Discharge Diagnoses Principal Problem:   Unstable angina Active Problems:   CAD (coronary artery disease)   Hypertension   Hyperlipidemia   Allergies Allergies  Allergen Reactions  . Eggs Or Egg-Derived Products Anaphylaxis    Procedures  Cardiac catheterization   PROCEDURAL FINDINGS  Hemodynamics:  AO 109/74 mean 91 mm Hg  LV 113/6 mm Hg   PCI Data:  Vessel - RCA/Segment - mid  Percent Stenosis (pre) 80%  TIMI-flow 3  Stent 3.5 x 16 mm Promus  Percent Stenosis (post) 0%  TIMI-flow (post) 3   Final Conclusions:  1. 2 vessel obstructive CAD. Progressive disease in the mid RCA. Patent stent in the LCx/OM1.  2. Normal LV function.  3. Successful stenting of the mid RCA with DES.  Recommendations:  DAPT for one year. Since the distal LCx was similar in appearance to 2012 and is a small branch this was not treated with PCI. If he has refractory chest pain may consider PTCA of this lesion but I think he will do well on medical therapy.        Hospital Course  The patient is a 43 year old male with past medical history significant for coronary artery disease who presented to [facility] ED on 01/27/2014 with chest pain that started around 10 PM on the previous night. The symptom was associated with mild nausea. Patient had a history of NSTEMI in 2012, cardiac catheterization at that time showed 50-60% RCA stenosis, 50-60% LAD stenosis, 95% left circumflex stenosis and he received a bare-metal stent in 2012. When his chest pain recurred prior to this admission, patient took some nitroglycerin with relief. While in the ED, he developed similar chest discomfort. Given his past medical history and similarity of current chest discomfort with previous MI,  the decision was made to undergo cardiac catheterization. Serial troponins over night were negative. He was also pretreated for possible contrast dye reaction.  Patient underwent cardiac catheterization in the morning of 01/27/2014 which showed two-vessel obstructive CAD with 80% mid RCA stenosis treated with drug-eluting stent, 90% focal ostial terminal left circumflex branch stenosis which was jailed by the previous stent, patent stent in the left circumflex/OM 1. EF was noted to be 55-65% during cardiac cath.  Patient was seen in the morning of 01/28/2014, at which time he was ambulating with cardiac rehabilitation withnot any further chest pain. His Lipitor was increased to 80 mg daily. And he will continue on aspirin, lisinopril, and beta blocker. He is deemed stable for discharge from cardiology perspective. He was given 30 day free coupon for Effient and 1 year prescription.  Discharge Vitals Blood pressure 115/65, pulse 66, temperature 98.1 F (36.7 C), temperature source Oral, resp. rate 20, height 6\' 1"  (1.854 m), weight 260 lb 14.4 oz (118.343 kg), SpO2 97.00%.  Filed Weights   01/26/14 2359 01/27/14 0544  Weight: 260 lb (117.935 kg) 260 lb 14.4 oz (118.343 kg)    Labs  CBC  Recent Labs  01/27/14 0005 01/28/14 0426  WBC 11.8* 19.2*  HGB 15.9 15.3  HCT 45.0 44.1  MCV 91.8 89.6  PLT 239 160   Basic Metabolic Panel  Recent Labs  01/27/14 1116 01/28/14 0426  NA 135* 138  K 4.2 4.3  CL 99 100  CO2 22 23  GLUCOSE 101*  137*  BUN 12 13  CREATININE 1.02 0.90  CALCIUM 9.6 9.7   Cardiac Enzymes  Recent Labs  01/27/14 0627 01/27/14 1116 01/27/14 2011  TROPONINI <0.30 <0.30 <0.30   Fasting Lipid Panel  Recent Labs  01/27/14 0625  CHOL 159  HDL 53  LDLCALC 95  TRIG 56  CHOLHDL 3.0    Disposition  Pt is being discharged home today in good condition.  Follow-up Plans & Appointments      Follow-up Information   Follow up with Erlene Quan, PA-C On  02/16/2014. (2:30pm)    Specialty:  Cardiology   Contact information:   [address] [place] [address] [phone]       Discharge Medications    Medication List    STOP taking these medications       metoprolol tartrate 25 MG tablet  Commonly known as:  LOPRESSOR      TAKE these medications       albuterol 108 (90 BASE) MCG/ACT inhaler  Commonly known as:  PROVENTIL HFA;VENTOLIN HFA  Inhale 2 puffs into the lungs every 6 (six) hours as needed for wheezing or shortness of breath.     aspirin 81 MG EC tablet  Take 1 tablet (81 mg total) by mouth daily.     atorvastatin 80 MG tablet  Commonly known as:  LIPITOR  Take 1 tablet (80 mg total) by mouth daily.  Start taking on:  01/29/2014     bisoprolol 5 MG tablet  Commonly known as:  ZEBETA  Take 0.5 tablets (2.5 mg total) by mouth daily.     buPROPion 150 MG 12 hr tablet  Commonly known as:  WELLBUTRIN SR  Take 150 mg by mouth 2 (two) times daily.     lisinopril 5 MG tablet  Commonly known as:  PRINIVIL,ZESTRIL  Take 1 tablet (5 mg total) by mouth daily.     multivitamin with minerals tablet  Take 1 tablet by mouth daily.     nitroGLYCERIN 0.4 MG SL tablet  Commonly known as:  NITROSTAT  Place 0.4 mg under the tongue every 5 (five) minutes as needed for chest pain.     prasugrel 10 MG Tabs tablet  Commonly known as:  EFFIENT  Take 1 tablet (10 mg total) by mouth daily.        Duration of Discharge Encounter   Greater than 30 minutes including physician time.  Hilbert Corrigan PA-C Pager: [phone] 01/28/2014, 9:44 PM

## 2014-01-28 NOTE — Progress Notes (Addendum)
Subjective:   S/p DES to RCA on 10/6. EF normal. No further CP. Ambulating with CR.     Intake/Output Summary (Last 24 hours) at 01/28/14 1436 Last data filed at 01/28/14 0900  Gross per 24 hour  Intake 1248.67 ml  Output   1600 ml  Net -351.33 ml    Current meds: . aspirin EC  81 mg Oral Daily  . atorvastatin  40 mg Oral Daily  . bisoprolol  2.5 mg Oral Daily  . buPROPion  150 mg Oral BID  . lisinopril  5 mg Oral Daily  . prasugrel  10 mg Oral Daily  . sodium chloride  3 mL Intravenous Q12H   Infusions: . nitroGLYCERIN Stopped (01/27/14 1037)     Objective:  Blood pressure 119/72, pulse 76, temperature 98.1 F (36.7 C), temperature source Oral, resp. rate 20, height 6\' 1"  (1.854 m), weight 118.343 kg (260 lb 14.4 oz), SpO2 97.00%. Weight change:   Physical Exam: General:  Well appearing. No resp difficulty HEENT: normal Neck: supple. JVP . Carotids 2+ bilat; no bruits. No lymphadenopathy or thryomegaly appreciated. Cor: PMI nondisplaced. Regular rate & rhythm. No rubs, gallops or murmurs. Lungs: clear Abdomen: soft, nontender, nondistended. No hepatosplenomegaly. No bruits or masses. Good bowel sounds. Extremities: no cyanosis, clubbing, rash, edema Neuro: alert & orientedx3, cranial nerves grossly intact. moves all 4 extremities w/o difficulty. Affect pleasant  Telemetry: NSR  Lab Results: Basic Metabolic Panel:  Recent Labs Lab 01/27/14 0005 01/27/14 0625 01/27/14 1116 01/28/14 0426  NA 140 138 135* 138  K 4.5 4.2 4.2 4.3  CL 102 101 99 100  CO2 25 24 22 23   GLUCOSE 98 95 101* 137*  BUN 17 14 12 13   CREATININE 1.17 1.09 1.02 0.90  CALCIUM 9.7 9.7 9.6 9.7   Liver Function Tests: No results found for this basename: AST, ALT, ALKPHOS, BILITOT, PROT, ALBUMIN,  in the last 168 hours No results found for this basename: LIPASE, AMYLASE,  in the last 168 hours No results found for this basename: AMMONIA,  in the last 168 hours CBC:  Recent Labs Lab  01/27/14 0005 01/28/14 0426  WBC 11.8* 19.2*  HGB 15.9 15.3  HCT 45.0 44.1  MCV 91.8 89.6  PLT 239 241   Cardiac Enzymes:  Recent Labs Lab 01/27/14 0240 01/27/14 0627 01/27/14 1116 01/27/14 2011  TROPONINI <0.30 <0.30 <0.30 <0.30   BNP: No components found with this basename: POCBNP,  CBG: No results found for this basename: GLUCAP,  in the last 168 hours Microbiology: Lab Results  Component Value Date   CULT NO GROWTH 10/12/2010   No results found for this basename: CULT, SDES,  in the last 168 hours  Imaging: Dg Chest 2 View  01/27/2014   CLINICAL DATA:  Acute onset of diffuse chest pain. Current history of hypertension. Initial encounter.  EXAM: CHEST  2 VIEW  COMPARISON:  Chest radiograph from 10/11/2010  FINDINGS: The lungs are well-aerated and clear. There is no evidence of focal opacification, pleural effusion or pneumothorax.  The heart is normal in size; the mediastinal contour is within normal limits. No acute osseous abnormalities are seen.  IMPRESSION: No acute cardiopulmonary process seen.   Electronically Signed   By: Garald Balding M.D.   On: 01/27/2014 01:50     ASSESSMENT:  1. [place]/CAD s/p DES to RCA   PLAN/DISCUSSION:  Doing well s/p DES to RCA. Can go to floor. Increase lipitor to 80. Continue ASA, Effient, b-blocker. CR referral.  Home today.   LOS: 1 day    Glori Bickers, MD 01/28/2014, 2:36 PM

## 2014-01-29 ENCOUNTER — Emergency Department (HOSPITAL_COMMUNITY): Payer: BC Managed Care – PPO

## 2014-01-29 ENCOUNTER — Encounter (HOSPITAL_COMMUNITY): Payer: Self-pay | Admitting: Emergency Medicine

## 2014-01-29 ENCOUNTER — Other Ambulatory Visit: Payer: Self-pay

## 2014-01-29 ENCOUNTER — Observation Stay (HOSPITAL_COMMUNITY)
Admission: EM | Admit: 2014-01-29 | Discharge: 2014-01-30 | Disposition: A | Discharge status: Home / Self Care | Payer: BC Managed Care – PPO | Attending: Internal Medicine | Admitting: Internal Medicine

## 2014-01-29 DIAGNOSIS — Z955 Presence of coronary angioplasty implant and graft: Secondary | ICD-10-CM

## 2014-01-29 DIAGNOSIS — I10 Essential (primary) hypertension: Secondary | ICD-10-CM | POA: Diagnosis present

## 2014-01-29 DIAGNOSIS — R079 Chest pain, unspecified: Principal | ICD-10-CM

## 2014-01-29 DIAGNOSIS — I2583 Coronary atherosclerosis due to lipid rich plaque: Secondary | ICD-10-CM | POA: Insufficient documentation

## 2014-01-29 DIAGNOSIS — I2510 Atherosclerotic heart disease of native coronary artery without angina pectoris: Secondary | ICD-10-CM | POA: Diagnosis not present

## 2014-01-29 DIAGNOSIS — E785 Hyperlipidemia, unspecified: Secondary | ICD-10-CM | POA: Diagnosis present

## 2014-01-29 DIAGNOSIS — I200 Unstable angina: Secondary | ICD-10-CM

## 2014-01-29 LAB — PROTIME-INR
INR: 0.97 (ref 0.00–1.49)
PROTHROMBIN TIME: 12.9 s (ref 11.6–15.2)

## 2014-01-29 LAB — CBC
HCT: 44.5 % (ref 39.0–52.0)
HEMOGLOBIN: 15.7 g/dL (ref 13.0–17.0)
MCH: 32.1 pg (ref 26.0–34.0)
MCHC: 35.3 g/dL (ref 30.0–36.0)
MCV: 91 fL (ref 78.0–100.0)
Platelets: 223 10*3/uL (ref 150–400)
RBC: 4.89 MIL/uL (ref 4.22–5.81)
RDW: 13.6 % (ref 11.5–15.5)
WBC: 11.6 10*3/uL — AB (ref 4.0–10.5)

## 2014-01-29 LAB — BASIC METABOLIC PANEL
Anion gap: 12 (ref 5–15)
BUN: 12 mg/dL (ref 6–23)
CHLORIDE: 102 meq/L (ref 96–112)
CO2: 25 mEq/L (ref 19–32)
Calcium: 9.5 mg/dL (ref 8.4–10.5)
Creatinine, Ser: 1.13 mg/dL (ref 0.50–1.35)
GFR calc Af Amer: >90 mL/min (ref >90)
GFR calc non Af Amer: 79 mL/min — ABNORMAL LOW (ref >90)
GLUCOSE: 95 mg/dL (ref 70–99)
POTASSIUM: 4.3 meq/L (ref 3.7–5.3)
Sodium: 139 mEq/L (ref 137–147)

## 2014-01-29 LAB — I-STAT TROPONIN, ED: Troponin i, poc: 0.02 ng/mL (ref 0.00–0.08)

## 2014-01-29 LAB — APTT: aPTT: 29 seconds (ref 24–37)

## 2014-01-29 MED ORDER — NITROGLYCERIN 0.4 MG SL SUBL: 0.4000 mg | SUBLINGUAL_TABLET | SUBLINGUAL | Status: DC | PRN

## 2014-01-29 MED ORDER — ALPRAZOLAM 0.25 MG PO TABS
0.2500 mg | ORAL_TABLET | Freq: Once | ORAL | Status: AC
  Administered 2014-01-29: 0.25 mg via ORAL
  Filled 2014-01-29: qty 1

## 2014-01-29 MED ORDER — BUPROPION HCL ER (SR) 150 MG PO TB12
150.0000 mg | ORAL_TABLET | Freq: Two times a day (BID) | ORAL | Status: DC
  Administered 2014-01-29 – 2014-01-30 (×2): 150 mg via ORAL
  Filled 2014-01-29 (×3): qty 1

## 2014-01-29 MED ORDER — SODIUM CHLORIDE 0.9 % IJ SOLN: 3.0000 mL | Freq: Two times a day (BID) | INTRAMUSCULAR | Status: DC

## 2014-01-29 MED ORDER — SODIUM CHLORIDE 0.9 % IV SOLN: 250.0000 mL | INTRAVENOUS | Status: DC | PRN

## 2014-01-29 MED ORDER — NITROGLYCERIN 2 % TD OINT
0.5000 [in_us] | TOPICAL_OINTMENT | Freq: Four times a day (QID) | TRANSDERMAL | Status: DC
  Administered 2014-01-29 – 2014-01-30 (×2): 0.5 [in_us] via TOPICAL
  Filled 2014-01-29: qty 30

## 2014-01-29 MED ORDER — ASPIRIN 81 MG PO CHEW
81.0000 mg | CHEWABLE_TABLET | ORAL | Status: AC
  Administered 2014-01-30: 81 mg via ORAL
  Filled 2014-01-29: qty 1

## 2014-01-29 MED ORDER — SODIUM CHLORIDE 0.9 % IJ SOLN: 3.0000 mL | INTRAMUSCULAR | Status: DC | PRN

## 2014-01-29 MED ORDER — ONDANSETRON HCL 4 MG/2ML IJ SOLN: 4.0000 mg | Freq: Four times a day (QID) | INTRAMUSCULAR | Status: DC | PRN

## 2014-01-29 MED ORDER — SODIUM CHLORIDE 0.9 % IJ SOLN
3.0000 mL | INTRAMUSCULAR | Status: DC | PRN
Start: 2014-01-29 — End: 2014-01-30

## 2014-01-29 MED ORDER — NITROGLYCERIN IN D5W 200-5 MCG/ML-% IV SOLN
0.0000 ug/min | Freq: Once | INTRAVENOUS | Status: AC
  Administered 2014-01-29: 10 ug/min via INTRAVENOUS
  Filled 2014-01-29: qty 250

## 2014-01-29 MED ORDER — HEPARIN SODIUM (PORCINE) 5000 UNIT/ML IJ SOLN
5000.0000 [IU] | Freq: Three times a day (TID) | INTRAMUSCULAR | Status: DC
  Administered 2014-01-29 – 2014-01-30 (×2): 5000 [IU] via SUBCUTANEOUS
  Filled 2014-01-29 (×5): qty 1

## 2014-01-29 MED ORDER — ACETAMINOPHEN 325 MG PO TABS: 650.0000 mg | ORAL_TABLET | ORAL | Status: DC | PRN

## 2014-01-29 MED ORDER — LISINOPRIL 5 MG PO TABS
5.0000 mg | ORAL_TABLET | Freq: Every day | ORAL | Status: DC
  Administered 2014-01-30: 13:00:00 5 mg via ORAL
  Filled 2014-01-29: qty 1

## 2014-01-29 MED ORDER — ADULT MULTIVITAMIN W/MINERALS CH
1.0000 | ORAL_TABLET | Freq: Every day | ORAL | Status: DC
  Administered 2014-01-30: 1 via ORAL
  Filled 2014-01-29: qty 1

## 2014-01-29 MED ORDER — BISOPROLOL FUMARATE 5 MG PO TABS
2.5000 mg | ORAL_TABLET | Freq: Every day | ORAL | Status: DC
Start: 2014-01-30 — End: 2014-01-30
  Filled 2014-01-29: qty 0.5

## 2014-01-29 MED ORDER — SODIUM CHLORIDE 0.9 % IV SOLN
INTRAVENOUS | Status: DC
  Administered 2014-01-30 (×2): via INTRAVENOUS

## 2014-01-29 MED ORDER — PRASUGREL HCL 10 MG PO TABS
10.0000 mg | ORAL_TABLET | Freq: Every day | ORAL | Status: DC
  Administered 2014-01-30: 13:00:00 10 mg via ORAL
  Filled 2014-01-29: qty 1

## 2014-01-29 MED ORDER — ALBUTEROL SULFATE (2.5 MG/3ML) 0.083% IN NEBU: 3.0000 mL | INHALATION_SOLUTION | Freq: Four times a day (QID) | RESPIRATORY_TRACT | Status: DC | PRN

## 2014-01-29 MED ORDER — ASPIRIN 325 MG PO TABS
325.0000 mg | ORAL_TABLET | Freq: Every day | ORAL | Status: DC
Start: 2014-01-29 — End: 2014-01-29
  Administered 2014-01-29: 325 mg via ORAL
  Filled 2014-01-29: qty 1

## 2014-01-29 MED ORDER — ATORVASTATIN CALCIUM 80 MG PO TABS
80.0000 mg | ORAL_TABLET | Freq: Every day | ORAL | Status: DC
  Filled 2014-01-29: qty 1

## 2014-01-29 MED ORDER — ASPIRIN EC 81 MG PO TBEC
81.0000 mg | DELAYED_RELEASE_TABLET | Freq: Every day | ORAL | Status: DC
  Filled 2014-01-29: qty 1

## 2014-01-29 NOTE — ED Notes (Signed)
Patient reports intermittent chest pain and lightheadedness at 1000 today.  Pt took 2SL nitro PTA with relief of chest pain to 1/10.  Pt had cath with 1 stent placed and was d/c'd from hospital on Tuesday.  Pt denies pain at this time.

## 2014-01-29 NOTE — ED Notes (Signed)
Dr. Rancour at bedside. 

## 2014-01-29 NOTE — ED Notes (Signed)
Mid-sternal cp since 1000 am - took ntg sl. 0.4 mg with relief to 1/10.  Pain radiates to left side of jaw.  Pt. Just had a cardiac cath. On 10/6.

## 2014-01-29 NOTE — H&P (Signed)
ADMISSION HISTORY & PHYSICAL   Chief Complaint:  Chest pain  Cardiologist: Dr. Gwenlyn Found  Primary Care Physician: PROVIDER NOT IN SYSTEM  HPI:  This is a 43 y.o. male with a past medical history significant for CAD. In 2012 presented with sharp SSCP EKG with ST depression Suffered NSTEMI. Cardiac cath showed a 50%-60% RCA, 50-60% LAD, and a 95% circumflex at the bifurcation of the OM with a 60%-70% narrowing after the bifurcation, and a 60% narrowing in the OM. He underwent Bare metal stenting to the circumflex.  Reports that he saw Dr Gwenlyn Found a few times after Had a stress Test that was negative. Since doing well f/u with primary care after. Says that lipids are not as good as they should be. QUit smoking at time of MI. Recently admitted for SSCP Pressure. Troponins were negative. Patient underwent cardiac catheterization in the morning of 01/27/2014 which showed two-vessel obstructive CAD with 80% mid RCA stenosis treated with drug-eluting stent, 90% focal ostial terminal left circumflex branch stenosis which was jailed by the previous stent, patent stent in the left circumflex/OM 1. EF was noted to be 55-65% during cardiac cath. He was discharged on 80 mg lipitor (increase) along with other medications. There was a small branch distal LCX which was not treated - it appeared similar to 2012, but Dr. Martinique mentioned this could be amenable to PTCA if he were to have anginal symptoms. He was just discharged from the hospital yesterday.  He now returns with chest pain that he had after exertion as well as at rest. He experienced a severe episode of chest pain today while driving. He denied any nausea, vomiting or diaphoresis. He did feel some tightness in his throat and continues to feel tightness in his chest. He took a nitroglycerin with some relief however it took about 10 minutes. The pain initially was about 8-9/10 and is currently 2/10. EKG shows normal sinus rhythm with nonspecific T-wave  changes. There are no significant changes compared to his prior EKG on October 7. Initial point-of-care troponins are negative.  PMHx:  Past Medical History  Diagnosis Date  . Coronary artery disease     a. NSTEMI   . Hypertension   . Hyperlipidemia     Past Surgical History  Procedure Laterality Date  . Coronary stent placement      FAMHx:  Family History  Problem Relation Age of Onset  . Cancer Father     pancreatic  . Heart attack Father   . Heart attack Paternal Grandfather     SOCHx:   reports that he quit smoking about 3 years ago. He does not have any smokeless tobacco history on file. He reports that he drinks alcohol. He reports that he does not use illicit drugs.  ALLERGIES:  Allergies  Allergen Reactions  . Eggs Or Egg-Derived Products Anaphylaxis    ROS: A comprehensive review of systems was negative except for: Cardiovascular: positive for chest pain  HOME MEDS:   Medication List    ASK your doctor about these medications       albuterol 108 (90 BASE) MCG/ACT inhaler  Commonly known as:  PROVENTIL HFA;VENTOLIN HFA  Inhale 2 puffs into the lungs every 6 (six) hours as needed for wheezing or shortness of breath.     aspirin 81 MG EC tablet  Take 1 tablet (81 mg total) by mouth daily.     atorvastatin 80 MG tablet  Commonly known as:  LIPITOR  Take 1 tablet (  80 mg total) by mouth daily.     bisoprolol 5 MG tablet  Commonly known as:  ZEBETA  Take 0.5 tablets (2.5 mg total) by mouth daily.     buPROPion 150 MG 12 hr tablet  Commonly known as:  WELLBUTRIN SR  Take 150 mg by mouth 2 (two) times daily.     lisinopril 5 MG tablet  Commonly known as:  PRINIVIL,ZESTRIL  Take 1 tablet (5 mg total) by mouth daily.     multivitamin with minerals tablet  Take 1 tablet by mouth daily.     nitroGLYCERIN 0.4 MG SL tablet  Commonly known as:  NITROSTAT  Place 0.4 mg under the tongue every 5 (five) minutes as needed for chest pain.     prasugrel 10  MG Tabs tablet  Commonly known as:  EFFIENT  Take 1 tablet (10 mg total) by mouth daily.       LABS/IMAGING: Results for orders placed during the hospital encounter of 01/29/14 (from the past 48 hour(s))  CBC     Status: Abnormal   Collection Time    01/29/14  6:21 PM      Result Value Ref Range   WBC 11.6 (*) 4.0 - 10.5 K/uL   RBC 4.89  4.22 - 5.81 MIL/uL   Hemoglobin 15.7  13.0 - 17.0 g/dL   HCT 44.5  39.0 - 52.0 %   MCV 91.0  78.0 - 100.0 fL   MCH 32.1  26.0 - 34.0 pg   MCHC 35.3  30.0 - 36.0 g/dL   RDW 13.6  11.5 - 15.5 %   Platelets 223  150 - 400 K/uL  BASIC METABOLIC PANEL     Status: Abnormal   Collection Time    01/29/14  6:21 PM      Result Value Ref Range   Sodium 139  137 - 147 mEq/L   Potassium 4.3  3.7 - 5.3 mEq/L   Chloride 102  96 - 112 mEq/L   CO2 25  19 - 32 mEq/L   Glucose, Bld 95  70 - 99 mg/dL   BUN 12  6 - 23 mg/dL   Creatinine, Ser 1.13  0.50 - 1.35 mg/dL   Calcium 9.5  8.4 - 10.5 mg/dL   GFR calc non Af Amer 79 (*) >90 mL/min   GFR calc Af Amer >90  >90 mL/min   Comment: (NOTE)     The eGFR has been calculated using the CKD EPI equation.     This calculation has not been validated in all clinical situations.     eGFR's persistently <90 mL/min signify possible Chronic Kidney     Disease.   Anion gap 12  5 - 15  I-STAT TROPOININ, ED     Status: None   Collection Time    01/29/14  6:32 PM      Result Value Ref Range   Troponin i, poc 0.02  0.00 - 0.08 ng/mL   Comment 3            Comment: Due to the release kinetics of cTnI,     a negative result within the first hours     of the onset of symptoms does not rule out     myocardial infarction with certainty.     If myocardial infarction is still suspected,     repeat the test at appropriate intervals.   Dg Chest 2 View  01/29/2014   CLINICAL DATA:  Mid chest pain radiating  into the left side of the neck. No shortness of breath.  EXAM: CHEST  2 VIEW  COMPARISON:  01/27/2014  FINDINGS: The heart  size and mediastinal contours are within normal limits. Both lungs are clear. The visualized skeletal structures are unremarkable.  IMPRESSION: No active cardiopulmonary disease.   Electronically Signed   By: Kathreen Devoid   On: 01/29/2014 19:26    VITALS: Filed Vitals:   01/29/14 1900  BP:   Pulse: 69  Temp:   Resp: 17    EXAM: General appearance: alert, mild distress and moderately obese Neck: no carotid bruit and no JVD Lungs: clear to auscultation bilaterally Heart: regular rate and rhythm, S1, S2 normal, no murmur, click, rub or gallop Abdomen: soft, non-tender; bowel sounds normal; no masses,  no organomegaly Extremities: extremities normal, atraumatic, no cyanosis or edema Pulses: 2+ and symmetric Skin: Skin color, texture, turgor normal. No rashes or lesions Neurologic: Grossly normal Psych: Mildly anxious  IMPRESSION: Principal Problem:   Unstable angina Active Problems:   CAD (coronary artery disease)   Hypertension   Hyperlipidemia   S/P coronary artery stent placement   PLAN: 1. Mr. Drumwright is presenting with chest pain that is reminiscent of the pain he had prior to his recent angioplasty. The pain was somewhat associated with exertion but also noted at rest. It radiated initially to his neck and was partially relieved with nitroglycerin. He says the severity is much less than his original presentation. It would be unusual for him to have an acute problem related to stand with no significant EKG changes, however he could have a small edge dissection. In addition there was a distal circumflex vessel which was left alone and felt possibly a candidate for PTCA. It would be unusual for that to give him rest pain. Nonetheless, I think he needs another heart catheterization. If no new obstructive lesions are found in his stent remains patent, I would recommend further optimization of medical therapy.  Pixie Casino, MD, [facility] Attending Cardiologist [facility]  HILTY,Kenneth C 01/29/2014, 7:47 PM

## 2014-01-29 NOTE — ED Provider Notes (Signed)
CSN: [id number]     Arrival date & time 01/29/14  1809 History   First MD Initiated Contact with Patient 01/29/14 1832     Chief Complaint  Patient presents with  . Chest Pain     (Consider location/radiation/quality/duration/timing/severity/associated sxs/prior Treatment) HPI Comments: Patient is a 43 year old male with history of coronary artery disease, hypertension, hyperlipidemia who presents to the emergency department today for evaluation of chest pain. He reports that on 10/6 he had a cardiac cath done. 1 a new stent was placed at this time. Today around 11 AM he developed chest tightness with associated diaphoresis and lightheadedness. He took one nitroglycerin and the pain resolved. He took a nap and when he woke up he had significant chest tightness with radiation to his neck. Again he became diaphoretic. He reports that both of these episodes lasted 30-45 minutes. He denies shortness of breath with these episodes. These symptoms feel the symptoms he was having on 10/6 prior to the cardiac cath.   Patient is a 43 y.o. male presenting with chest pain. The history is provided by the patient. No language interpreter was used.  Chest Pain Associated symptoms: diaphoresis   Associated symptoms: no fever and no shortness of breath     Past Medical History  Diagnosis Date  . Coronary artery disease     a. NSTEMI   . Hypertension   . Hyperlipidemia    Past Surgical History  Procedure Laterality Date  . Coronary stent placement     Family History  Problem Relation Age of Onset  . Cancer Father     pancreatic  . Heart attack Father   . Heart attack Paternal Grandfather    History  Substance Use Topics  . Smoking status: Former Smoker    Quit date: 01/28/2011  . Smokeless tobacco: Not on file  . Alcohol Use: Yes    Review of Systems  Constitutional: Positive for diaphoresis. Negative for fever and chills.  Respiratory: Positive for chest tightness. Negative for shortness  of breath.   Cardiovascular: Positive for chest pain.  All other systems reviewed and are negative.     Allergies  Eggs or egg-derived products  Home Medications   Prior to Admission medications   Medication Sig Start Date End Date Taking? Authorizing Provider  albuterol (PROVENTIL HFA;VENTOLIN HFA) 108 (90 BASE) MCG/ACT inhaler Inhale 2 puffs into the lungs every 6 (six) hours as needed for wheezing or shortness of breath.   Yes Historical Provider, MD  aspirin EC 81 MG EC tablet Take 1 tablet (81 mg total) by mouth daily. 01/28/14  Yes Almyra Deforest, PA  atorvastatin (LIPITOR) 80 MG tablet Take 1 tablet (80 mg total) by mouth daily. 01/29/14  Yes Almyra Deforest, PA  bisoprolol (ZEBETA) 5 MG tablet Take 0.5 tablets (2.5 mg total) by mouth daily. 01/28/14  Yes Almyra Deforest, PA  buPROPion (WELLBUTRIN SR) 150 MG 12 hr tablet Take 150 mg by mouth 2 (two) times daily.   Yes Historical Provider, MD  lisinopril (PRINIVIL,ZESTRIL) 5 MG tablet Take 1 tablet (5 mg total) by mouth daily. 01/28/14  Yes Almyra Deforest, PA  Multiple Vitamins-Minerals (MULTIVITAMIN WITH MINERALS) tablet Take 1 tablet by mouth daily.   Yes Historical Provider, MD  nitroGLYCERIN (NITROSTAT) 0.4 MG SL tablet Place 0.4 mg under the tongue every 5 (five) minutes as needed for chest pain.   Yes Historical Provider, MD  prasugrel (EFFIENT) 10 MG TABS tablet Take 1 tablet (10 mg total) by mouth daily. 01/28/14  Yes Almyra Deforest, PA   BP 109/63  Pulse 64  Temp(Src) 97.9 F (36.6 C)  Resp 20  SpO2 96% Physical Exam  Nursing note and vitals reviewed. Constitutional: He is oriented to person, place, and time. He appears well-developed and well-nourished. No distress.  HENT:  Head: Normocephalic and atraumatic.  Right Ear: External ear normal.  Left Ear: External ear normal.  Nose: Nose normal.  Eyes: Conjunctivae are normal.  Neck: Normal range of motion. No tracheal deviation present.  Cardiovascular: Normal rate, regular rhythm, normal heart  sounds, intact distal pulses and normal pulses.   Pulses:      Radial pulses are 2+ on the right side, and 2+ on the left side.       Posterior tibial pulses are 2+ on the right side, and 2+ on the left side.  Pulmonary/Chest: Effort normal and breath sounds normal. No stridor.  Abdominal: Soft. He exhibits no distension. There is no tenderness.  Musculoskeletal: Normal range of motion.  Neurological: He is alert and oriented to person, place, and time.  Skin: Skin is warm and dry. He is not diaphoretic.  Psychiatric: He has a normal mood and affect. His behavior is normal.    ED Course  Procedures (including critical care time) Labs Review Labs Reviewed  CBC - Abnormal; Notable for the following:    WBC 11.6 (*)    All other components within normal limits  BASIC METABOLIC PANEL - Abnormal; Notable for the following:    GFR calc non Af Amer 79 (*)    All other components within normal limits  I-STAT TROPOININ, ED    Imaging Review Dg Chest 2 View  01/29/2014   CLINICAL DATA:  Mid chest pain radiating into the left side of the neck. No shortness of breath.  EXAM: CHEST  2 VIEW  COMPARISON:  01/27/2014  FINDINGS: The heart size and mediastinal contours are within normal limits. Both lungs are clear. The visualized skeletal structures are unremarkable.  IMPRESSION: No active cardiopulmonary disease.   Electronically Signed   By: Kathreen Devoid   On: 01/29/2014 19:26     EKG Interpretation   Date/Time:  Thursday January 29 2014 18:15:03 EDT Ventricular Rate:  80 PR Interval:  140 QRS Duration: 90 QT Interval:  388 QTC Calculation: 409 R Axis:   63 Text Interpretation:  Normal sinus rhythm with sinus arrhythmia  Nonspecific T wave abnormality Abnormal ECG No significant change was  found Confirmed by Wyvonnia Dusky  MD, STEPHEN [phone]) on 01/29/2014 6:39:49 PM      MDM   Final diagnoses:  S/P coronary artery stent placement  Coronary artery disease due to lipid rich plaque   Hyperlipidemia  Essential hypertension  Unstable angina   Concern for cardiac etiology of Chest Pain. Cardiology has been consulted and will see patient in the ED for likely admit. Pt does not meet criteria for CP protocol and a further evaluation is recommended. Pt has been re-evaluated prior to consult and VSS, NAD, heart RRR, pain 0/10, lungs CTAB. No acute abnormalities found on EKG and first round of cardiac enzymes negative. This case was discussed with Dr. Wyvonnia Dusky who has seen the patient and agrees with plan to admit.     Elwyn Lade, PA-C 01/29/14 2015

## 2014-01-30 ENCOUNTER — Encounter (HOSPITAL_COMMUNITY): Payer: Self-pay | Admitting: Physician Assistant

## 2014-01-30 ENCOUNTER — Encounter (HOSPITAL_COMMUNITY)
Admission: EM | Disposition: A | Discharge status: Home / Self Care | Payer: Self-pay | Source: Home / Self Care | Attending: Emergency Medicine

## 2014-01-30 DIAGNOSIS — I25110 Atherosclerotic heart disease of native coronary artery with unstable angina pectoris: Secondary | ICD-10-CM

## 2014-01-30 DIAGNOSIS — R079 Chest pain, unspecified: Secondary | ICD-10-CM | POA: Diagnosis not present

## 2014-01-30 DIAGNOSIS — I10 Essential (primary) hypertension: Secondary | ICD-10-CM | POA: Diagnosis not present

## 2014-01-30 DIAGNOSIS — I2510 Atherosclerotic heart disease of native coronary artery without angina pectoris: Secondary | ICD-10-CM | POA: Diagnosis not present

## 2014-01-30 DIAGNOSIS — I2583 Coronary atherosclerosis due to lipid rich plaque: Secondary | ICD-10-CM | POA: Diagnosis not present

## 2014-01-30 HISTORY — PX: LEFT HEART CATHETERIZATION WITH CORONARY ANGIOGRAM: SHX5451

## 2014-01-30 LAB — TROPONIN I
Troponin I: <0.3 ng/mL (ref <0.30)
Troponin I: <0.3 ng/mL (ref <0.30)

## 2014-01-30 SURGERY — LEFT HEART CATHETERIZATION WITH CORONARY ANGIOGRAM
Anesthesia: LOCAL

## 2014-01-30 MED ORDER — NITROGLYCERIN 1 MG/10 ML FOR IR/CATH LAB
INTRA_ARTERIAL | Status: AC
  Filled 2014-01-30: qty 10

## 2014-01-30 MED ORDER — HEPARIN SODIUM (PORCINE) 5000 UNIT/ML IJ SOLN: 5000.0000 [IU] | Freq: Three times a day (TID) | INTRAMUSCULAR | Status: DC

## 2014-01-30 MED ORDER — MIDAZOLAM HCL 2 MG/2ML IJ SOLN
INTRAMUSCULAR | Status: AC
  Filled 2014-01-30: qty 2

## 2014-01-30 MED ORDER — CETYLPYRIDINIUM CHLORIDE 0.05 % MT LIQD
7.0000 mL | Freq: Two times a day (BID) | OROMUCOSAL | Status: DC
Start: 2014-01-30 — End: 2014-01-30

## 2014-01-30 MED ORDER — ONDANSETRON HCL 4 MG/2ML IJ SOLN: 4.0000 mg | Freq: Four times a day (QID) | INTRAMUSCULAR | Status: DC | PRN

## 2014-01-30 MED ORDER — FENTANYL CITRATE 0.05 MG/ML IJ SOLN
INTRAMUSCULAR | Status: AC
  Filled 2014-01-30: qty 2

## 2014-01-30 MED ORDER — ACETAMINOPHEN 325 MG PO TABS
650.0000 mg | ORAL_TABLET | ORAL | Status: DC | PRN
  Administered 2014-01-30: 650 mg via ORAL
  Filled 2014-01-30: qty 2

## 2014-01-30 MED ORDER — HEPARIN (PORCINE) IN NACL 2-0.9 UNIT/ML-% IJ SOLN
INTRAMUSCULAR | Status: AC
  Filled 2014-01-30: qty 1500

## 2014-01-30 MED ORDER — SODIUM CHLORIDE 0.9 % IV SOLN: 1.0000 mL/kg/h | INTRAVENOUS | Status: DC

## 2014-01-30 MED ORDER — LIDOCAINE HCL (PF) 1 % IJ SOLN
INTRAMUSCULAR | Status: AC
  Filled 2014-01-30: qty 30

## 2014-01-30 MED ORDER — VERAPAMIL HCL 2.5 MG/ML IV SOLN
INTRAVENOUS | Status: AC
  Filled 2014-01-30: qty 2

## 2014-01-30 MED ORDER — NITROGLYCERIN IN D5W 200-5 MCG/ML-% IV SOLN: 0.0000 ug/min | Freq: Once | INTRAVENOUS | Status: DC

## 2014-01-30 NOTE — ED Provider Notes (Signed)
Medical screening examination/treatment/procedure(s) were conducted as a shared visit with non-physician practitioner(s) and myself.  I personally evaluated the patient during the encounter.  Episodes of left-sided chest tightness with radiation to neck with diaphoresis and lightheadedness. Stent placement earlier this week. EKG unchanged. Presentation concerning for angina. Known CAD that was not stented previously. IV ntg for ongoing chest pain.  CRITICAL CARE Performed by: Ezequiel Essex Total critical care time: 30 Critical care time was exclusive of separately billable procedures and treating other patients. Critical care was necessary to treat or prevent imminent or life-threatening deterioration. Critical care was time spent personally by me on the following activities: development of treatment plan with patient and/or surrogate as well as nursing, discussions with consultants, evaluation of patient's response to treatment, examination of patient, obtaining history from patient or surrogate, ordering and performing treatments and interventions, ordering and review of laboratory studies, ordering and review of radiographic studies, pulse oximetry and re-evaluation of patient's condition.    EKG Interpretation   Date/Time:  Thursday January 29 2014 18:15:03 EDT Ventricular Rate:  67 PR Interval:  140 QRS Duration: 90 QT Interval:  388 QTC Calculation: 409 R Axis:   63 Text Interpretation:  Normal sinus rhythm with sinus arrhythmia  Nonspecific T wave abnormality Abnormal ECG No significant change was  found Confirmed by Wyvonnia Dusky  MD, Dantonio Justen ([id number]) on 01/29/2014 6:39:49 PM        Ezequiel Essex, MD 01/30/14 [id number]

## 2014-01-30 NOTE — Progress Notes (Signed)
Pt c/o 2/10 dull chest pain similar to what brought him into hospital. Dr. Irish Lack notified and orders received to restart nitro drip.  Nitro drip restarted and pt had relief of pain

## 2014-01-30 NOTE — Interval H&P Note (Signed)
Cath Lab Visit (complete for each Cath Lab visit)  Clinical Evaluation Leading to the Procedure:   ACS: Yes.    Non-ACS:    Anginal Classification: CCS IV  Anti-ischemic medical therapy: Minimal Therapy (1 class of medications)  Non-Invasive Test Results: No non-invasive testing performed  Prior CABG: No previous CABG      History and Physical Interval Note:  01/30/2014 9:25 AM  Angel Gutierrez  has presented today for surgery, with the diagnosis of unstable angina  The various methods of treatment have been discussed with the patient and family. After consideration of risks, benefits and other options for treatment, the patient has consented to  Procedure(s): LEFT HEART CATHETERIZATION WITH CORONARY ANGIOGRAM (N/A) as a surgical intervention .  The patient's history has been reviewed, patient examined, no change in status, stable for surgery.  I have reviewed the patient's chart and labs.  Questions were answered to the patient's satisfaction.     Stephan Nelis S.

## 2014-01-30 NOTE — Progress Notes (Signed)
Patient ambulated the hall and tolerated well denies pain. Patient wants to go home feeling much better.

## 2014-01-30 NOTE — Progress Notes (Signed)
UR completed Alisabeth Selkirk K. Jeromey Kruer, RN, BSN, [place], CCM  01/30/2014 12:11 PM

## 2014-01-30 NOTE — CV Procedure (Addendum)
       PROCEDURE:  Left heart catheterization with selective coronary angiography, left ventriculogram.  INDICATIONS:  Unstable angina  The risks, benefits, and details of the procedure were explained to the patient.  The patient verbalized understanding and wanted to proceed.  Informed written consent was obtained.  PROCEDURE TECHNIQUE:  After Xylocaine anesthesia a 78F slender sheath was placed in the right radial artery with a single anterior needle wall stick.   Right coronary angiography was done using a Judkins R4 guide catheter.  Left coronary angiography was done using a Judkins L3.5 guide catheter.  It was difficult to keep a guide in the left main due to tortuosity more proximally. Left ventriculography was done using a pigtail catheter.  A TR band was used for hemostasis.   CONTRAST:  Total of 140 cc.  COMPLICATIONS:  None.    HEMODYNAMICS:  Aortic pressure was 117/79; LV pressure was 121/3; LVEDP 9.  There was no gradient between the left ventricle and aorta.    ANGIOGRAPHIC DATA:   The left main coronary artery is widely patent.  The left anterior descending artery is a large vessel which reaches the apex. There is mild disease in the mid vessel, up to 50% in the cranial views. There several small diagonal vessels which are widely patent.  The left circumflex artery is a large vessel proximally. There is mild disease just proximal to the previously placed stent. The OM1 is large and widely patent. The remainder of the circumflex has an ostial 90% stenosis due to being jailed by the prior stent.  This is unchanged from previous.  The right coronary artery is a large dominant vessel. The stent in the mid vessel is widely patent. There is mild disease in the mid RCA. The posterolateral artery is large and widely patent. The posterior descending artery is medium size and widely patent. The stent edges appear intact.  LEFT VENTRICULOGRAM:  Left ventricular angiogram was not done.  LVEDP was 10 mmHg.  IMPRESSIONS:  1. Normal left main coronary artery. 2. Mild to moderate diffuse disease in the mid left anterior descending artery. 3. Patent stent in the left circumflex artery extending into the OM1. 4. Patent stent in the mid right coronary artery. 5. LVEDP 9 mmHg.  Ejection fraction not assessed.  RECOMMENDATION:  No significant change from the prior cath a few days ago. Stent appears widely patent in the right coronary artery. No change in the jailed true circumflex compared to prior. No clear etiology of the patient's discomfort. Continue aggressive risk factor modification. He was pain-free at the end of the procedure.  No family were present to speak with.  Followup with Dr. Gwenlyn Found.

## 2014-01-30 NOTE — Discharge Summary (Addendum)
Discharge Summary   Patient ID: Angel Gutierrez MRN: [id number], DOB/AGE: 28-Feb-1971 43 y.o. Admit date: 01/29/2014 D/C date:     01/30/2014  Primary Cardiologist: Dr. Gwenlyn Found   Principal Problem:   Recurrent chest pain Active Problems:   CAD (coronary artery disease)   Hypertension   Hyperlipidemia   S/P coronary artery stent placement    Admission Dates: 01/29/14-01/30/14 Discharge Diagnosis: Chest pain- likely non cardiac s/p re-look LHC with patent stents   HPI: Angel Gutierrez is a 43 y.o. male with a history of CAD s/p BMS to LCx in (2012); recent DES to RCA on 01/27/14, HLD, and HTN who presented to [facility] on 01/29/14 with recurrent chest pain.  Patient underwent cardiac catheterization earlier this week on the morning of 01/27/2014 which showed two-vessel obstructive CAD with 80% mid RCA stenosis treated with drug-eluting stent, 90% focal ostial terminal left circumflex branch stenosis which was jailed by the previous stent, patent stent in the left circumflex/OM 1. EF was noted to be 55-65% during cardiac cath. He was discharged on Effient and 80 mg lipitor (increase) along with other cardiac medications. There was a small branch distal LCX which was not treated - it appeared similar to 2012, but Dr. Martinique mentioned this could be amenable to PTCA if he were to have anginal symptoms. He was discharged home on 01/28/14. He then returned to the ED on 01/29/14  with chest pain that he had after exertion as well as at rest. He experienced a severe episode of chest pain on the day of admission while driving. He denied any nausea, vomiting or diaphoresis. He did feel some tightness in his throat and continued to feel tightness in his chest. He took a nitroglycerin with some relief however it took about 10 minutes. The pain initially was about 8-9/10 and brought down to a 2/10 with NTG. EKG showed NSR with nonspecific T-wave changes. There are no significant changes compared to his prior EKG on October 7.      Hospital Course  Recurrent chest pain-  -- Troponin neg x2 -- ECG stable from previous.  -- Repeat [place] on 01/29/14 with  1. Normal left main coronary artery. 2. Mild to moderate diffuse disease in the mid left anterior descending artery. 3. Patent stent in the left circumflex artery extending into the OM1. 4. Patent stent in the mid right coronary artery. 5. LVEDP 9 mmHg. Ejection fraction not assessed. --No significant change from the prior cath a few days ago. Stent appears widely patent in the right coronary artery. No change in the jailed true circumflex compared to prior. No clear etiology of the patient's discomfort. Continue aggressive risk factor modification. He was pain-free at the end of the procedure. -- Continue Lipitor 80mg , aspirin/effient, lisinopril, and bisoprolol 2.5 mg  HTN- continue Lisinopril 5mg  and bisoprolol 2.5 mg  HLD- continue statin  The patient has had an uncomplicated hospital course and is recovering well. The radial catheter site is stable. He has been seen by Dr. Irish Lack today and deemed ready for discharge home. All follow-up appointments have been scheduled. Discharge medications are listed below. No changes have been made to his medical regimen.   Discharge Vitals: Blood pressure 124/82, pulse 76, temperature 97.7 F (36.5 C), temperature source Oral, resp. rate 20, height 6\' 1"  (1.854 m), weight 264 lb 12.4 oz (120.1 kg), SpO2 96.00%.  Labs: Lab Results  Component Value Date   WBC 11.6* 01/29/2014   HGB 15.7 01/29/2014   HCT 44.5  01/29/2014   MCV 91.0 01/29/2014   PLT 223 01/29/2014     Recent Labs Lab 01/29/14 1821  NA 139  K 4.3  CL 102  CO2 25  BUN 12  CREATININE 1.13  CALCIUM 9.5  GLUCOSE 95    Recent Labs  01/27/14 2011 01/29/14 2330 01/30/14 0345  TROPONINI <0.30 <0.30 <0.30   Lab Results  Component Value Date   CHOL 159 01/27/2014   HDL 53 01/27/2014   LDLCALC 95 01/27/2014   TRIG 56 01/27/2014     Diagnostic  Studies/Procedures   Dg Chest 2 View  01/29/2014   CLINICAL DATA:  Mid chest pain radiating into the left side of the neck. No shortness of breath.  EXAM: CHEST  2 VIEW  COMPARISON:  01/27/2014  FINDINGS: The heart size and mediastinal contours are within normal limits. Both lungs are clear. The visualized skeletal structures are unremarkable.  IMPRESSION: No active cardiopulmonary disease.   Electronically Signed   By: Kathreen Devoid   On: 01/29/2014 19:26   Dg Chest 2 View  01/27/2014   CLINICAL DATA:  Acute onset of diffuse chest pain. Current history of hypertension. Initial encounter.  EXAM: CHEST  2 VIEW  COMPARISON:  Chest radiograph from 10/11/2010  FINDINGS: The lungs are well-aerated and clear. There is no evidence of focal opacification, pleural effusion or pneumothorax.  The heart is normal in size; the mediastinal contour is within normal limits. No acute osseous abnormalities are seen.  IMPRESSION: No acute cardiopulmonary process seen.   Electronically Signed   By: Garald Balding M.D.   On: 01/27/2014 01:50     01/30/14: PROCEDURE: Left heart catheterization with selective coronary angiography, left ventriculogram.  INDICATIONS: Unstable angina  The risks, benefits, and details of the procedure were explained to the patient. The patient verbalized understanding and wanted to proceed. Informed written consent was obtained.  PROCEDURE TECHNIQUE: After Xylocaine anesthesia a 31F slender sheath was placed in the right radial artery with a single anterior needle wall stick. Right coronary angiography was done using a Judkins R4 guide catheter. Left coronary angiography was done using a Judkins L3.5 guide catheter. It was difficult to keep a guide in the left main due to tortuosity more proximally. Left ventriculography was done using a pigtail catheter. A TR band was used for hemostasis.  CONTRAST: Total of 140 cc.  COMPLICATIONS: None.  HEMODYNAMICS: Aortic pressure was 117/79; LV pressure was  121/3; LVEDP 9. There was no gradient between the left ventricle and aorta.  ANGIOGRAPHIC DATA: The left main coronary artery is widely patent.  The left anterior descending artery is a large vessel which reaches the apex. There is mild disease in the mid vessel, up to 50% in the cranial views. There several small diagonal vessels which are widely patent.  The left circumflex artery is a large vessel proximally. There is mild disease just proximal to the previously placed stent. The OM1 is large and widely patent. The remainder of the circumflex has an ostial 90% stenosis due to being jailed by the prior stent. This is unchanged from previous.  The right coronary artery is a large dominant vessel. The stent in the mid vessel is widely patent. There is mild disease in the mid RCA. The posterolateral artery is large and widely patent. The posterior descending artery is medium size and widely patent. The stent edges appear intact.  LEFT VENTRICULOGRAM: Left ventricular angiogram was not done. LVEDP was 10 mmHg.  IMPRESSIONS:  6. Normal  left main coronary artery. 7. Mild to moderate diffuse disease in the mid left anterior descending artery. 8. Patent stent in the left circumflex artery extending into the OM1. 9. Patent stent in the mid right coronary artery. 10. LVEDP 9 mmHg. Ejection fraction not assessed. RECOMMENDATION: No significant change from the prior cath a few days ago. Stent appears widely patent in the right coronary artery. No change in the jailed true circumflex compared to prior. No clear etiology of the patient's discomfort. Continue aggressive risk factor modification. He was pain-free at the end of the procedure.  Followup with Dr. Gwenlyn Found.     01/28/15:  Cardiac Catheterization Procedure Note  Name: Angel Gutierrez  MRN: [id number]  DOB: December 03, 1970  Procedure: Left Heart Cath, Selective Coronary Angiography, LV angiography, PTCA and stenting of the mid RCA  Indication: 43 yo WM with  history of CAD s/p stenting of the LCx in 2012 with a BMS presents with USAP.  Procedural Details: The right wrist was prepped, draped, and anesthetized with 1% lidocaine. Using the modified Seldinger technique, a 6 French slender sheath was introduced into the right radial artery. 3 mg of verapamil was administered through the sheath, weight-based unfractionated heparin was administered intravenously. Standard Judkins catheters were used for selective coronary angiography and left ventriculography. Catheter exchanges were performed over an exchange length guidewire.  PROCEDURAL FINDINGS  Hemodynamics:  AO 109/74 mean 91 mm Hg  LV 113/6 mm Hg  Coronary angiography:  Coronary dominance: right  Left mainstem: Normal.  Left anterior descending (LAD): Diffuse disease in the proximal vessel up to 40%. There is mild calcification.  Left circumflex (LCx): The LCx gives rise to a single large OM then terminates in a small branch on the lateral wall. The stent in the proximal LCx extending into OM1 is widely patent. The ostium of the terminal LCx branch has a 90% focal stenosis (jailed by stent).  Right coronary artery (RCA): There is an 80% stenosis in the mid vessel. The RCA is a large dominant vessel.  Left ventriculography: Left ventricular systolic function is normal, LVEF is estimated at 55-65%, there is no significant mitral regurgitation  PCI Note: Following the diagnostic procedure, the decision was made to proceed with PCI of the RCA which has progressed since 2012. The distal LCx appears similar to post stent appearance in 2012. Effient 60 mg was given orally. Weight-based bivalirudin was given for anticoagulation. Once a therapeutic ACT was achieved, a 6 [place] FR4 guide catheter was inserted. A prowater coronary guidewire was used to cross the lesion. The lesion was predilated with a 2.5 mm balloon. The lesion was then stented with a 3.5 x 16 mm Promus stent. The stent was postdilated with a 3.75 mm  noncompliant balloon. Following PCI, there was 0% residual stenosis and TIMI-3 flow. Final angiography confirmed an excellent result. The patient tolerated the procedure well. There were no immediate procedural complications. A TR band was used for radial hemostasis. The patient was transferred to the post catheterization recovery area for further monitoring.  PCI Data:  Vessel - RCA/Segment - mid  Percent Stenosis (pre) 80%  TIMI-flow 3  Stent 3.5 x 16 mm Promus  Percent Stenosis (post) 0%  TIMI-flow (post) 3  Final Conclusions:  1. 2 vessel obstructive CAD. Progressive disease in the mid RCA. Patent stent in the LCx/OM1.  2. Normal LV function.  3. Successful stenting of the mid RCA with DES.  Recommendations:  DAPT for one year. Since the distal LCx was similar  in appearance to 2012 and is a small branch this was not treated with PCI. If he has refractory chest pain may consider PTCA of this lesion but I think he will do well on medical therapy.      Discharge Medications     Medication List         albuterol 108 (90 BASE) MCG/ACT inhaler  Commonly known as:  PROVENTIL HFA;VENTOLIN HFA  Inhale 2 puffs into the lungs every 6 (six) hours as needed for wheezing or shortness of breath.     aspirin 81 MG EC tablet  Take 1 tablet (81 mg total) by mouth daily.     atorvastatin 80 MG tablet  Commonly known as:  LIPITOR  Take 1 tablet (80 mg total) by mouth daily.     bisoprolol 5 MG tablet  Commonly known as:  ZEBETA  Take 0.5 tablets (2.5 mg total) by mouth daily.     buPROPion 150 MG 12 hr tablet  Commonly known as:  WELLBUTRIN SR  Take 150 mg by mouth 2 (two) times daily.     lisinopril 5 MG tablet  Commonly known as:  PRINIVIL,ZESTRIL  Take 1 tablet (5 mg total) by mouth daily.     multivitamin with minerals tablet  Take 1 tablet by mouth daily.     nitroGLYCERIN 0.4 MG SL tablet  Commonly known as:  NITROSTAT  Place 0.4 mg under the tongue every 5 (five) minutes as  needed for chest pain.     prasugrel 10 MG Tabs tablet  Commonly known as:  EFFIENT  Take 1 tablet (10 mg total) by mouth daily.        Disposition   The patient will be discharged in stable condition to home.  Follow-up Information   Follow up with Erlene Quan, PA-C On 02/16/2014. (@ 2:30pm)    Specialty:  Cardiology   Contact information:   [address] [place] [address] [phone]         Duration of Discharge Encounter: Greater than 30 minutes including physician and PA time.  SignedAngelena Form R PA-C 01/30/2014, 4:31 PM   I have examined the patient and reviewed assessment and plan and discussed with patient.  Agree with above as stated.  Patent RCA stent on cath.  Plan for discharge.  Medical therapy.  Consider Imdur if the patient has recurrent sx.  VARANASI,JAYADEEP S.

## 2014-01-30 NOTE — Discharge Instructions (Signed)

## 2014-01-30 NOTE — Care Management Note (Addendum)
  Page 1 of 1   01/30/2014     12:17:47 PM CARE MANAGEMENT NOTE 01/30/2014  Patient:  Angel Gutierrez, Angel Gutierrez   Account Number:  [id number]  Date Initiated:  01/30/2014  Documentation initiated by:  Mariann Laster  Subjective/Objective Assessment:   unstable angina     Action/Plan:   CM to follow for disposition needs   Anticipated DC Date:  02/02/2014   Anticipated DC Plan:  HOME/SELF CARE         Choice offered to / List presented to:             Status of service:  In process, will continue to follow Medicare Important Message given?   (If response is "NO", the following Medicare IM given date fields will be blank) Date Medicare IM given:   Medicare IM given by:   Date Additional Medicare IM given:   Additional Medicare IM given by:    Discharge Disposition:    Per UR Regulation:  Reviewed for med. necessity/level of care/duration of stay  If discussed at [place] of Stay Meetings, dates discussed:    Comments:  Murline Weigel RN, BSN, MSHL, CCM  Nurse - Case Manager,  (Unit [phone]  01/30/2014 PROCEDURE:  Left heart catheterization with selective coronary angiography, left ventriculogram on 01/30/14 nitroGLYCERIN 50 mg in dextrose 5 % 250 mL (0.2 mg/mL) infusion  :  Dose 0-200 mcg/min  :  0-60 mL/hr Dispo Plan:  Home / self care Med Review;  Effient:  active prior to this admission CM will continue to monitor for d/c needs.

## 2014-01-31 NOTE — Discharge Summary (Signed)
Agree. He is ready for d/c today  Benay Spice 1:53 PM

## 2014-02-03 ENCOUNTER — Telehealth: Payer: Self-pay | Admitting: Cardiovascular Disease

## 2014-02-03 NOTE — Telephone Encounter (Signed)
Called and spoke with patient regarding Cardiac Rehab.  I explained that a staff member from [facility] will be calling to schedule his appointment.  He voiced his understanding.

## 2014-02-04 ENCOUNTER — Telehealth: Payer: Self-pay | Admitting: Cardiovascular Disease

## 2014-02-04 NOTE — Telephone Encounter (Signed)
Received copy of E Mail from [place] --from Satira Sark with [place] form.  Sent to [employer] [facility] [place] for process letter.  Sent 02/04/14 lp

## 2014-02-06 ENCOUNTER — Telehealth: Payer: Self-pay | Admitting: Cardiology

## 2014-02-06 NOTE — Telephone Encounter (Signed)
Pt called in stating that that he had 2 stents put in last week; one on 10/5 and the other on 10/9. He is requesting some leave of absence or FMLA papers . Please call  Thanks

## 2014-02-06 NOTE — Telephone Encounter (Signed)
Returned call to patient he stated he will be bringing FMLA paper work to office for Dr.Jordan to sign.

## 2014-02-16 ENCOUNTER — Encounter: Payer: Self-pay | Admitting: Cardiology

## 2014-02-16 ENCOUNTER — Ambulatory Visit (INDEPENDENT_AMBULATORY_CARE_PROVIDER_SITE_OTHER): Payer: BC Managed Care – PPO | Admitting: Cardiology

## 2014-02-16 VITALS — BP 108/60 | HR 64 | Ht 73.0 in | Wt 263.1 lb

## 2014-02-16 DIAGNOSIS — I25118 Atherosclerotic heart disease of native coronary artery with other forms of angina pectoris: Secondary | ICD-10-CM

## 2014-02-16 DIAGNOSIS — E785 Hyperlipidemia, unspecified: Secondary | ICD-10-CM

## 2014-02-16 DIAGNOSIS — I10 Essential (primary) hypertension: Secondary | ICD-10-CM

## 2014-02-16 NOTE — Assessment & Plan Note (Signed)
On statin.

## 2014-02-16 NOTE — Assessment & Plan Note (Signed)
Controlled.  

## 2014-02-16 NOTE — Patient Instructions (Signed)
Your physician recommends that you schedule a follow-up appointment in: 6 Months with Dr Berry    

## 2014-02-16 NOTE — Assessment & Plan Note (Signed)
Hx PCI stenting- recent admission for DES, followed by restudy 48 hrs later (patent DES) for chest pain c/w his angina.

## 2014-02-16 NOTE — Progress Notes (Signed)
02/16/2014 Angel Gutierrez   1971-04-22  [id number]  Primary [place], DO Primary Cardiologist: Dr Gwenlyn Found  HPI:  43 y.o. male with a past medical history significant for CAD. In 2012 presented with a NSTEMI. Cardiac cath then showed a 50%-60% RCA, 50-60% LAD, and a 95% circumflex at the bifurcation of the OM with a 60%-70% narrowing after the bifurcation, and a 60% narrowing in the OM. He underwent Bare metal stenting to the circumflex. QUit smoking at time of MI.            He was admitted 01/27/14 for SSCP Pressure. Troponins were negative. Patient underwent cardiac catheterization in the morning of 01/27/2014 which showed two-vessel obstructive CAD with 80% mid RCA stenosis treated with drug-eluting stent, and a 90% focal ostial terminal left circumflex branch stenosis which was jailed by the previous stent. He had a patent stent in the left circumflex/OM 1. EF was noted to be 55-65% during cardiac cath. There was a small branch distal LCX which was not treated - it appeared similar to 2012, but Dr. Martinique mentioned this could be amenable to PTCA if he were to have anginal symptoms.            He returned to the hospital 01/29/14 with recurrent chest pain similar to his pre PCI symptoms. He went back to the lab 01/30/14. This revealed no significant change from the prior cath a few days prior. Stent appears widely patent in the right coronary artery. No change in the jailed true circumflex compared to prior. No clear etiology of the patient's discomfort . Plan is for continued medical Rx. He says he felt "lousy" for a couple of days but feels OK now. He had an episode of nausea and "flu like" symptoms while watching the football game Sunday but this passed.  He is back to work- Stage manager.       Current Outpatient Prescriptions  Medication Sig Dispense Refill  . albuterol (PROVENTIL HFA;VENTOLIN HFA) 108 (90 BASE) MCG/ACT inhaler Inhale 2 puffs into the lungs every 6  (six) hours as needed for wheezing or shortness of breath.      Marland Kitchen aspirin EC 81 MG EC tablet Take 1 tablet (81 mg total) by mouth daily.      Marland Kitchen atorvastatin (LIPITOR) 80 MG tablet Take 1 tablet (80 mg total) by mouth daily.  30 tablet  11  . bisoprolol (ZEBETA) 5 MG tablet Take 0.5 tablets (2.5 mg total) by mouth daily.  30 tablet  6  . buPROPion (WELLBUTRIN SR) 150 MG 12 hr tablet Take 150 mg by mouth 2 (two) times daily.      Marland Kitchen lisinopril (PRINIVIL,ZESTRIL) 5 MG tablet Take 1 tablet (5 mg total) by mouth daily.  30 tablet  6  . Multiple Vitamins-Minerals (MULTIVITAMIN WITH MINERALS) tablet Take 1 tablet by mouth daily.      . nitroGLYCERIN (NITROSTAT) 0.4 MG SL tablet Place 0.4 mg under the tongue every 5 (five) minutes as needed for chest pain.      . prasugrel (EFFIENT) 10 MG TABS tablet Take 1 tablet (10 mg total) by mouth daily.  30 tablet  10   No current facility-administered medications for this visit.    Allergies  Allergen Reactions  . Eggs Or Egg-Derived Products Anaphylaxis    History   Social History  . Marital Status: Married    Spouse Name: N/A    Number of Children: N/A  . Years of Education: N/A  Occupational History  . Not on file.   Social History Main Topics  . Smoking status: Former Smoker    Quit date: 01/28/2011  . Smokeless tobacco: Not on file  . Alcohol Use: Yes  . Drug Use: No  . Sexual Activity: Not on file   Other Topics Concern  . Not on file   Social History Narrative  . No narrative on file     Review of Systems: General: negative for chills, fever, night sweats or weight changes.  Cardiovascular: negative for chest pain, dyspnea on exertion, edema, orthopnea, palpitations, paroxysmal nocturnal dyspnea or shortness of breath Dermatological: negative for rash Respiratory: negative for cough or wheezing Urologic: negative for hematuria Abdominal: negative for nausea, vomiting, diarrhea, bright red blood per rectum, melena, or  hematemesis Neurologic: negative for visual changes, syncope, or dizziness All other systems reviewed and are otherwise negative except as noted above.    Blood pressure 108/60, pulse 64, height 6\' 1"  (1.854 m), weight 263 lb 1.6 oz (119.341 kg).  General appearance: alert, cooperative, no distress and mildly obese Neck: no carotid bruit and no JVD Lungs: clear to auscultation bilaterally Heart: regular rate and rhythm Rt radial site without hematoma  EKG NSR- 64  ASSESSMENT AND PLAN:   CAD (coronary artery disease) Hx PCI stenting- recent admission for DES, followed by restudy 48 hrs later (patent DES) for chest pain c/w his angina.  Hyperlipidemia On statin  Hypertension Controlled   PLAN  F/U Dr Gwenlyn Found in 6 months. I asked him to check his pulse if he had another episode like Sunday's. I suspect he has a tendency towards bradycardia as he was taken off metoprolol and placed on bisoprolol during this past admission.   Angel Gutierrez KPA-C 02/16/2014 3:22 PM

## 2014-02-26 ENCOUNTER — Encounter (HOSPITAL_COMMUNITY)
Admission: RE | Admit: 2014-02-26 | Discharge: 2014-02-26 | Disposition: A | Discharge status: Home / Self Care | Payer: 59 | Source: Ambulatory Visit | Attending: Cardiovascular Disease | Admitting: Cardiovascular Disease

## 2014-02-26 DIAGNOSIS — E785 Hyperlipidemia, unspecified: Secondary | ICD-10-CM | POA: Insufficient documentation

## 2014-02-26 DIAGNOSIS — J45909 Unspecified asthma, uncomplicated: Secondary | ICD-10-CM | POA: Insufficient documentation

## 2014-02-26 DIAGNOSIS — Z91012 Allergy to eggs: Secondary | ICD-10-CM | POA: Insufficient documentation

## 2014-02-26 DIAGNOSIS — Z955 Presence of coronary angioplasty implant and graft: Secondary | ICD-10-CM | POA: Insufficient documentation

## 2014-02-26 DIAGNOSIS — I10 Essential (primary) hypertension: Secondary | ICD-10-CM | POA: Insufficient documentation

## 2014-02-26 DIAGNOSIS — I2510 Atherosclerotic heart disease of native coronary artery without angina pectoris: Secondary | ICD-10-CM | POA: Insufficient documentation

## 2014-02-26 DIAGNOSIS — I252 Old myocardial infarction: Secondary | ICD-10-CM | POA: Insufficient documentation

## 2014-02-26 DIAGNOSIS — Z5189 Encounter for other specified aftercare: Secondary | ICD-10-CM | POA: Insufficient documentation

## 2014-02-26 NOTE — Progress Notes (Signed)
Cardiac Rehab Medication Review by a Pharmacist  Does the patient  feel that his/her medications are working for him/her?  Yes; except the inhaler which he uses more frequently  Has the patient been experiencing any side effects to the medications prescribed?  Yes; constipation  Does the patient measure his/her own blood pressure or blood glucose at home?  no   Does the patient have any problems obtaining medications due to transportation or finances?   no  Understanding of regimen: good Understanding of indications: good Potential of compliance: good    Pharmacist comments: Patient has a good understanding of his medications.  He complained of shortness of breath which he relates to smoking.  He has used his albuterol inhaler more frequently.  I recommended that he discuss this with his physician.  Consider adding a SAMA or LAMA agent such as ipratropium or tiotropium for further control. He complained of constipation, but he has recently changed his eating habits. He takes a stool softener over the counter (patient unsure name).   Theron Arista, PharmD Clinical Pharmacist - Resident Pager: [phone] 11/5/20158:31 AM

## 2014-03-01 ENCOUNTER — Other Ambulatory Visit: Payer: Self-pay | Admitting: Physician Assistant

## 2014-03-02 ENCOUNTER — Encounter (HOSPITAL_COMMUNITY)
Admission: RE | Admit: 2014-03-02 | Discharge: 2014-03-02 | Disposition: A | Discharge status: Home / Self Care | Payer: 59 | Source: Ambulatory Visit | Attending: Cardiovascular Disease | Admitting: Cardiovascular Disease

## 2014-03-02 DIAGNOSIS — I10 Essential (primary) hypertension: Secondary | ICD-10-CM | POA: Diagnosis not present

## 2014-03-02 DIAGNOSIS — E785 Hyperlipidemia, unspecified: Secondary | ICD-10-CM | POA: Diagnosis not present

## 2014-03-02 DIAGNOSIS — I252 Old myocardial infarction: Secondary | ICD-10-CM | POA: Diagnosis not present

## 2014-03-02 DIAGNOSIS — Z955 Presence of coronary angioplasty implant and graft: Secondary | ICD-10-CM | POA: Diagnosis not present

## 2014-03-02 DIAGNOSIS — J45909 Unspecified asthma, uncomplicated: Secondary | ICD-10-CM | POA: Diagnosis not present

## 2014-03-02 DIAGNOSIS — I2510 Atherosclerotic heart disease of native coronary artery without angina pectoris: Secondary | ICD-10-CM | POA: Diagnosis not present

## 2014-03-02 DIAGNOSIS — Z5189 Encounter for other specified aftercare: Secondary | ICD-10-CM | POA: Diagnosis not present

## 2014-03-02 DIAGNOSIS — Z91012 Allergy to eggs: Secondary | ICD-10-CM | POA: Diagnosis not present

## 2014-03-02 NOTE — Progress Notes (Signed)
Pt in for first day of exercise at the 6:45 am cardiac rehab phase II program. Pt tolerated exercise with no complaints.  Monitor showed SR with no noted ectopy.  Pt short term goal better endurance, lose weight.  Will monitor pt progress toward meeting this goal. Monitor daily weights, met level progression.  Plan to discuss home exercise with pt by the EP. Long term goal is to lose 20 pounds and 60 pounds in a year.  Nutritional classes offered on Tuesdays will encourage pt to attend.  PHQ2 score 1.  Pt takes Wellbutrin and this is working well for him.  Medication list reconciled.  Pt verbalizes compliance with medications. Cherre Huger, BSN

## 2014-03-04 ENCOUNTER — Encounter (HOSPITAL_COMMUNITY)
Admission: RE | Admit: 2014-03-04 | Discharge: 2014-03-04 | Disposition: A | Discharge status: Home / Self Care | Payer: 59 | Source: Ambulatory Visit | Attending: Cardiovascular Disease | Admitting: Cardiovascular Disease

## 2014-03-04 DIAGNOSIS — Z5189 Encounter for other specified aftercare: Secondary | ICD-10-CM | POA: Diagnosis not present

## 2014-03-06 ENCOUNTER — Encounter (HOSPITAL_COMMUNITY)
Admission: RE | Admit: 2014-03-06 | Discharge: 2014-03-06 | Disposition: A | Discharge status: Home / Self Care | Payer: 59 | Source: Ambulatory Visit | Attending: Cardiovascular Disease | Admitting: Cardiovascular Disease

## 2014-03-06 DIAGNOSIS — Z5189 Encounter for other specified aftercare: Secondary | ICD-10-CM | POA: Diagnosis not present

## 2014-03-09 ENCOUNTER — Encounter (HOSPITAL_COMMUNITY): Payer: 59

## 2014-03-11 ENCOUNTER — Encounter (HOSPITAL_COMMUNITY)
Admission: RE | Admit: 2014-03-11 | Discharge: 2014-03-11 | Disposition: A | Discharge status: Home / Self Care | Payer: 59 | Source: Ambulatory Visit | Attending: Cardiovascular Disease | Admitting: Cardiovascular Disease

## 2014-03-11 DIAGNOSIS — Z5189 Encounter for other specified aftercare: Secondary | ICD-10-CM | POA: Diagnosis not present

## 2014-03-13 ENCOUNTER — Encounter (HOSPITAL_COMMUNITY): Payer: 59

## 2014-03-16 ENCOUNTER — Encounter (HOSPITAL_COMMUNITY): Payer: 59

## 2014-03-18 ENCOUNTER — Encounter (HOSPITAL_COMMUNITY): Payer: 59

## 2014-03-23 ENCOUNTER — Encounter (HOSPITAL_COMMUNITY): Payer: 59

## 2014-03-25 ENCOUNTER — Encounter (HOSPITAL_COMMUNITY): Payer: 59

## 2014-03-27 ENCOUNTER — Encounter (HOSPITAL_COMMUNITY): Payer: 59

## 2014-03-30 ENCOUNTER — Encounter (HOSPITAL_COMMUNITY): Payer: 59

## 2014-04-01 ENCOUNTER — Encounter (HOSPITAL_COMMUNITY): Payer: 59

## 2014-04-02 ENCOUNTER — Encounter (HOSPITAL_COMMUNITY): Payer: Self-pay | Admitting: Cardiology

## 2014-04-03 ENCOUNTER — Encounter (HOSPITAL_COMMUNITY): Payer: 59

## 2014-04-06 ENCOUNTER — Encounter (HOSPITAL_COMMUNITY): Payer: 59

## 2014-04-06 ENCOUNTER — Telehealth (HOSPITAL_COMMUNITY): Payer: Self-pay | Admitting: *Deleted

## 2014-04-06 NOTE — Telephone Encounter (Signed)
Pt absent from exercise since 11/18.  Pt called with message left inquiring his well being.  Return call requested.  Contact number given for call back. Cherre Huger, BSN

## 2014-04-08 ENCOUNTER — Encounter (HOSPITAL_COMMUNITY): Payer: 59

## 2014-04-10 ENCOUNTER — Encounter (HOSPITAL_COMMUNITY): Payer: 59

## 2014-04-13 ENCOUNTER — Encounter (HOSPITAL_COMMUNITY): Payer: 59

## 2014-04-15 ENCOUNTER — Encounter (HOSPITAL_COMMUNITY): Admission: RE | Admit: 2014-04-15 | Payer: 59 | Source: Ambulatory Visit

## 2014-04-20 ENCOUNTER — Encounter (HOSPITAL_COMMUNITY): Payer: 59

## 2014-04-22 ENCOUNTER — Encounter (HOSPITAL_COMMUNITY): Payer: 59

## 2014-04-27 ENCOUNTER — Encounter (HOSPITAL_COMMUNITY): Payer: 59

## 2014-04-29 ENCOUNTER — Encounter (HOSPITAL_COMMUNITY): Payer: 59

## 2014-05-01 ENCOUNTER — Encounter (HOSPITAL_COMMUNITY): Payer: 59

## 2014-05-04 ENCOUNTER — Encounter (HOSPITAL_COMMUNITY): Payer: 59

## 2014-05-06 ENCOUNTER — Encounter (HOSPITAL_COMMUNITY): Payer: 59

## 2014-05-08 ENCOUNTER — Encounter (HOSPITAL_COMMUNITY): Payer: 59

## 2014-05-11 ENCOUNTER — Encounter (HOSPITAL_COMMUNITY): Payer: 59

## 2014-05-13 ENCOUNTER — Encounter (HOSPITAL_COMMUNITY): Payer: 59

## 2014-05-15 ENCOUNTER — Encounter (HOSPITAL_COMMUNITY): Payer: 59

## 2014-05-18 ENCOUNTER — Encounter (HOSPITAL_COMMUNITY): Payer: 59

## 2014-05-20 ENCOUNTER — Encounter (HOSPITAL_COMMUNITY): Payer: 59

## 2014-05-22 ENCOUNTER — Encounter (HOSPITAL_COMMUNITY): Payer: 59

## 2014-05-25 ENCOUNTER — Encounter (HOSPITAL_COMMUNITY): Payer: 59

## 2014-05-27 ENCOUNTER — Encounter (HOSPITAL_COMMUNITY): Payer: 59

## 2014-05-29 ENCOUNTER — Encounter (HOSPITAL_COMMUNITY): Payer: 59

## 2014-06-01 ENCOUNTER — Encounter (HOSPITAL_COMMUNITY): Payer: 59

## 2014-06-03 ENCOUNTER — Encounter (HOSPITAL_COMMUNITY): Payer: 59

## 2014-06-05 ENCOUNTER — Encounter (HOSPITAL_COMMUNITY): Payer: 59

## 2014-06-08 ENCOUNTER — Encounter: Payer: Self-pay | Admitting: Internal Medicine

## 2014-06-08 ENCOUNTER — Encounter (INDEPENDENT_AMBULATORY_CARE_PROVIDER_SITE_OTHER): Payer: Self-pay

## 2014-06-08 ENCOUNTER — Ambulatory Visit (INDEPENDENT_AMBULATORY_CARE_PROVIDER_SITE_OTHER): Payer: BLUE CROSS/BLUE SHIELD | Admitting: Internal Medicine

## 2014-06-08 VITALS — BP 138/86 | HR 71 | Temp 98.2°F | Ht 73.0 in | Wt 278.2 lb

## 2014-06-08 DIAGNOSIS — I10 Essential (primary) hypertension: Secondary | ICD-10-CM

## 2014-06-08 DIAGNOSIS — R0600 Dyspnea, unspecified: Secondary | ICD-10-CM

## 2014-06-08 MED ORDER — VALSARTAN 160 MG PO TABS: 160.0000 mg | ORAL_TABLET | Freq: Every day | ORAL | Status: DC

## 2014-06-08 NOTE — Assessment & Plan Note (Addendum)
Symptoms are markedly disproportionate to objective findings and not clear this is a lung problem but pt does appear to have difficult airway management issues. DDX of  difficult airways management all start with A and  include Adherence, Ace Inhibitors, Acid Reflux, Active Sinus Disease, Alpha 1 Antitripsin deficiency, Anxiety masquerading as Airways dz,  ABPA,  allergy(esp in young), Aspiration (esp in elderly), Adverse effects of DPI,  Active smokers, plus two Bs  = Bronchiectasis and Beta blocker use..and one C= CHF  ACEi at the top of the usual list of suspects> see hbp  ? Acid (or non-acid) GERD > always difficult to exclude as up to 75% of pts in some series report no assoc GI/ Heartburn symptoms> rec max   diet restrictions/ reviewed and instructions given in writing.   ? Anxiety related to wt gain/quit smoking > dx of exclusion but much higher here > reviewed elastic recoil properties of the lung using a rubber band as a model for this engineer.  ? BB > strongly doubt it on zebeta, one of the most specific BBs on market    Each maintenance medication was reviewed in detail including most importantly the difference between maintenance and as needed and under what circumstances the prns are to be used.  Please see instructions for details which were reviewed in writing and the patient given a copy.     Next step if f/u pfts

## 2014-06-08 NOTE — Assessment & Plan Note (Signed)
ACE inhibitors are problematic in  pts with airway complaints because  even experienced pulmonologists can't always distinguish ace effects from copd/asthma.  By themselves they don't actually cause a problem, much like oxygen can't by itself start a fire, but they certainly serve as a powerful catalyst or enhancer for any "fire"  or inflammatory process in the upper airway, be it caused by an ET  tube or more commonly reflux (especially in the obese or pts with known GERD or who are on biphoshonates).    In the era of ARB near equivalency until we have a better handle on the reversibility of the airway problem, it just makes sense to avoid ACEI  entirely in the short run  - it's the only way I know how to tell if this is partly or wholly another ace case  Try diovaan 160 mg daily and recheck in 4 weeks

## 2014-06-08 NOTE — Progress Notes (Signed)
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    06/08/2014 1st [facility] office visit/ Angel Gutierrez   Chief Complaint  Patient presents with  . PULMONARY CONSULT    Self referral asthma. Diagnosed as a child.   uses saba first thing in am x years then again up to 3 x daily but not while sleeping distinct from the sensation of waves of sob at rest since summer of 2015 assoc with dry cough.  Most intense exercise is jogging and the breathing problem doesn't bother him jogging/ sleeping and no difference if uses saba before exercising.    No obvious patterns in day to day or daytime variabilty or assoc excess/purulent sputum  cp or chest tightness, subjective wheeze overt sinus or hb symptoms. No unusual exp hx or h/o childhood pna or knowledge of premature birth.  Sleeping ok without nocturnal  or early am exacerbation  of respiratory  c/o's or need for noct saba. Also denies any obvious fluctuation of symptoms with weather or environmental changes or other aggravating or alleviating factors except as outlined above   Current Medications, Allergies, Complete Past Medical History, Past Surgical History, Family History, and Social History were reviewed in [employer] record.  ROS  The following are not active complaints unless bolded sore throat, dysphagia, dental problems, itching, sneezing,  nasal congestion or excess/ purulent secretions, ear ache,   fever, chills, sweats, unintended wt loss, pleuritic or exertional cp,  hemoptysis,  orthopnea pnd or leg swelling, presyncope, palpitations, heartburn, abdominal pain, anorexia, nausea, vomiting, diarrhea  or change in bowel or urinary habits, change in stools or urine, dysuria,hematuria,  rash, arthralgias, visual complaints, headache, numbness weakness or ataxia or problems with walking or coordination,  change in mood/affect or memory.          Review of Systems  Constitutional: Negative for fever and unexpected weight change.  HENT: Negative for congestion, dental problem, ear pain, nosebleeds, postnasal drip, rhinorrhea, sinus pressure, sneezing, sore throat and trouble swallowing.   Eyes: Negative for redness and itching.  Respiratory: Positive for cough and shortness of breath. Negative for chest tightness and wheezing.   Cardiovascular: Negative for palpitations and leg swelling.  Gastrointestinal: Negative for nausea and vomiting.  Genitourinary: Negative for dysuria.  Musculoskeletal: Negative for joint swelling.  Skin: Negative for rash.  Neurological: Negative for headaches.  Hematological: Does not bruise/bleed easily.  Psychiatric/Behavioral: Negative for dysphoric mood. The patient is not nervous/anxious.        Objective:   Physical Exam  amb wm nad  Wt Readings from Last 3 Encounters:  06/08/14 278 lb 3.2 oz (126.191 kg)  02/26/14 263 lb 7.2 oz (119.5 kg)  02/16/14 263 lb 1.6 oz (119.341 kg)    Vital signs reviewed  HEENT: nl dentition, turbinates, and orophanx. Nl external ear canals without cough reflex   NECK :  without JVD/Nodes/TM/ nl carotid upstrokes bilaterally   LUNGS: no acc muscle use, clear  to A and P bilaterally without cough on insp or exp maneuvers   CV:  RRR  no s3 or murmur or increase in P2, no edema   ABD:  soft and nontender with nl excursion in the supine position. No bruits or organomegaly, bowel sounds nl  MS:  warm without deformities, calf tenderness, cyanosis or clubbing  SKIN: warm and dry  without lesions    NEURO:  alert, approp, no deficits    I personally reviewed images and agree with radiology impression as follows:  CXR: 01/30/15  No active cardiopulmonary disease           Assessment & Plan:

## 2014-06-08 NOTE — Patient Instructions (Signed)
Stop lisinopril and start diovan (valsartan) 160 mg one daily   GERD (REFLUX)  is an extremely common cause of respiratory symptoms just like yours , many times with no obvious heartburn at all.    It can be treated with medication, but also with lifestyle changes including avoidance of late meals, excessive alcohol, smoking cessation, and avoid fatty foods, chocolate, peppermint, colas, red wine, and acidic juices such as orange juice.  NO MINT OR MENTHOL PRODUCTS SO NO COUGH DROPS  USE SUGARLESS CANDY INSTEAD (Jolley ranchers or Stover's or Life Savers) or even ice chips will also do - the key is to swallow to prevent all throat clearing. NO OIL BASED VITAMINS - use powdered substitutes - eat more fish, especially salmon  Please schedule a follow up office visit in 4 weeks, sooner if needed with pfts

## 2014-07-06 ENCOUNTER — Ambulatory Visit (INDEPENDENT_AMBULATORY_CARE_PROVIDER_SITE_OTHER): Payer: BLUE CROSS/BLUE SHIELD | Admitting: Internal Medicine

## 2014-07-06 ENCOUNTER — Encounter: Payer: Self-pay | Admitting: Internal Medicine

## 2014-07-06 VITALS — BP 126/80 | HR 91 | Ht 73.0 in | Wt 277.8 lb

## 2014-07-06 DIAGNOSIS — I10 Essential (primary) hypertension: Secondary | ICD-10-CM

## 2014-07-06 DIAGNOSIS — R0600 Dyspnea, unspecified: Secondary | ICD-10-CM

## 2014-07-06 LAB — PULMONARY FUNCTION TEST
DL/VA % pred: 111 %
DL/VA: 5.38 ml/min/mmHg/L
DLCO UNC: 34.02 ml/min/mmHg
DLCO unc % pred: 93 %
FEF 25-75 PRE: 3.03 L/s
FEF 25-75 Post: 3.93 L/sec
FEF2575-%CHANGE-POST: 29 %
FEF2575-%Pred-Post: 95 %
FEF2575-%Pred-Pre: 73 %
FEV1-%CHANGE-POST: 6 %
FEV1-%Pred-Post: 84 %
FEV1-%Pred-Pre: 79 %
FEV1-Post: 3.83 L
FEV1-Pre: 3.61 L
FEV1FVC-%Change-Post: 0 %
FEV1FVC-%Pred-Pre: 98 %
FEV6-%Change-Post: 5 %
FEV6-%PRED-POST: 87 %
FEV6-%Pred-Pre: 82 %
FEV6-POST: 4.88 L
FEV6-Pre: 4.63 L
FEV6FVC-%CHANGE-POST: 0 %
FEV6FVC-%PRED-PRE: 102 %
FEV6FVC-%Pred-Post: 103 %
FVC-%Change-Post: 5 %
FVC-%PRED-PRE: 80 %
FVC-%Pred-Post: 85 %
FVC-POST: 4.91 L
FVC-PRE: 4.65 L
PRE FEV6/FVC RATIO: 100 %
Post FEV1/FVC ratio: 78 %
Post FEV6/FVC ratio: 100 %
Pre FEV1/FVC ratio: 78 %
RV % pred: 94 %
RV: 1.93 L
TLC % PRED: 85 %
TLC: 6.45 L

## 2014-07-06 MED ORDER — FAMOTIDINE 20 MG PO TABS: ORAL_TABLET | ORAL | Status: DC

## 2014-07-06 NOTE — Progress Notes (Signed)
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 PULMONARY CONSULT    Self referral asthma. Diagnosed as a child.   uses saba first thing in am x years then again up to 3 x daily but not while sleeping distinct from the sensation of waves of sob at rest since summer of 2015 assoc with dry cough.  Most intense exercise is jogging and the breathing problem doesn't bother him jogging/ sleeping and no difference if uses saba before exercising.  rec Stop lisinopril and start diovan (valsartan) 160 mg one daily  GERD diet   07/06/2014 f/u ov/Angel Gutierrez re: pseudoasthma, off acei x 4 weeks  Chief Complaint  Patient presents with  . Follow-up    PFT done today. Pt states that his cough is unchanged. His breathing is some better.  He is using rescue inhaler once daily on average.   cough to point gagging after supper and goes away hs  Uses saba first thing in am  But cough / breathing never wakes him up  No obvious patterns in day to day or daytime variabilty or assoc excess/purulent sputum  cp or chest tightness, subjective wheeze overt sinus or hb symptoms. No unusual exp hx or h/o childhood pna or knowledge of premature birth.  Sleeping ok without nocturnal  or early am exacerbation  of respiratory  c/o's or need for noct saba. Also denies any obvious fluctuation of symptoms with  weather or environmental changes or other aggravating or alleviating factors except as outlined above   Current Medications, Allergies, Complete Past Medical History, Past Surgical History, Family History, and Social History were reviewed in [employer] record.  ROS  The following are not active complaints unless bolded sore throat, dysphagia, dental problems, itching, sneezing,  nasal congestion or excess/ purulent secretions, ear ache,   fever, chills, sweats, unintended wt loss, pleuritic or exertional cp, hemoptysis,  orthopnea pnd or leg swelling, presyncope, palpitations, heartburn, abdominal pain, anorexia, nausea, vomiting, diarrhea  or change in bowel or urinary habits, change in stools or urine, dysuria,hematuria,  rash, arthralgias, visual complaints, headache, numbness weakness or ataxia or problems with walking or coordination,  change in mood/affect or memory.                   Objective:   Physical Exam  amb wm nad 07/06/2014          278  Wt Readings from Last 3 Encounters:  06/08/14 278 lb 3.2 oz (126.191 kg)  02/26/14 263 lb 7.2 oz (119.5 kg)  02/16/14 263 lb 1.6 oz (119.341 kg)    Vital signs reviewed  HEENT: nl dentition, turbinates, and orophanx. Nl external ear canals without cough reflex   NECK :  without JVD/Nodes/TM/ nl carotid upstrokes  bilaterally   LUNGS: no acc muscle use, clear to A and P bilaterally without cough on insp or exp maneuvers   CV:  RRR  no s3 or murmur or increase in P2, no edema   ABD:  soft and nontender with nl excursion in the supine position. No bruits or organomegaly, bowel sounds nl  MS:  warm without deformities, calf tenderness, cyanosis or clubbing  SKIN: warm and dry without lesions    NEURO:  alert, approp, no deficits    I personally reviewed images and agree with radiology impression as follows:  CXR: 01/30/15  No active cardiopulmonary disease           Assessment & Plan:

## 2014-07-06 NOTE — Assessment & Plan Note (Signed)
-   trial off ACEi 06/08/2014 > 07/06/2014  minimally better so added h2 in pm  - 07/06/2014 pfts wnl except erv = 20%   I had an extended discussion with the patient reviewing all relevant studies completed to date and  lasting 15 to 20 minutes of a 25 minute visit on the following ongoing concerns:  1)  I reviewed the Flethcher curve with patient that basically indicates  if you quit smoking when your best day FEV1 is still well preserved (as is clearly the case here)  it is highly unlikely you will progress to severe disease and informed the patient there was no medication on the market that has proven to change the curve or the likelihood of progression.  Therefore   maintaining abstinence is the most important aspect of care, not choice of inhalers or for that matter, doctors.   2) he could have asthma but the absence of noct wheeze/ cough strongly suggests pseudoasthma/uacs.  Classic Upper airway cough syndrome, so named because it's frequently impossible to sort out how much is  CR/sinusitis with freq throat clearing (which can be related to primary GERD)   vs  causing  secondary (" extra esophageal")  GERD from wide swings in gastric pressure that occur with throat clearing, often  promoting self use of mint and menthol lozenges that reduce the lower esophageal sphincter tone and exacerbate the problem further in a cyclical fashion.   These are the same pts (now being labeled as having "irritable larynx syndrome" by some cough centers) who not infrequently have a history of having failed to tolerate ace inhibitors,  dry powder inhalers or biphosphonates or report having atypical reflux symptoms that don't respond to standard doses of PPI , and are easily confused as having aecopd or asthma flares by even experienced allergists/ pulmonologists.    3) rec h1 and h2 in pm's when he has most of his symptoms and f/u here prn  4) calorie balance issues also reviewed with goal of getting in and staying in  neg cal balance

## 2014-07-06 NOTE — Assessment & Plan Note (Addendum)
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

## 2014-07-06 NOTE — Progress Notes (Signed)
PFT done today. 

## 2014-07-06 NOTE — Patient Instructions (Signed)
pepcid ac 20 mg after supper available over the counter  For drainage/tickle/itchy/sneezy/  take chlortrimeton (chlorpheniramine) 4 mg every 4 hours available over the counter (may cause drowsiness)   Weight control is simply a matter of calorie balance which needs to be tilted in your favor by eating less and exercising more.  To get the most out of exercise, you need to be continuously aware that you are short of breath, but never out of breath, for 30 minutes daily. As you improve, it will actually be easier for you to do the same amount of exercise  in  30 minutes so always push to the level where you are short of breath.  If this does not result in gradual weight reduction then I strongly recommend you see a nutritionist with a food diary x 2 weeks so that we can work out a negative calorie balance which is universally effective in steady weight loss programs.  Think of your calorie balance like you do your bank account where in this case you want the balance to go down so you must take in less calories than you burn up.  It's just that simple:  Hard to do, but easy to understand.  Good luck!   Return in one month if not satisfied

## 2014-12-30 ENCOUNTER — Telehealth: Payer: Self-pay | Admitting: Cardiovascular Disease

## 2014-12-30 NOTE — Telephone Encounter (Signed)
Patient began having Chest "Pressure" mid-sternal over past couple of days that gets worse as day progresses. No notable other sx. Patient is s/p NSTEMI previously with stents in place. Patient did try NTG x1 last night. Stated it helped but just for a few minutes. Scheduled appt with Ellen Henri, PA, for Thursday, Sept 8th at 9:00 am and a follow up appt on Sept 20th with Dr. Gwenlyn Found. Patient advised to ask Ellen Henri, PA, whether to cancel Dr. Gwenlyn Found appt or not after she evaluates his current states tomorrow. Patient acknowledged that should he experience worsening CP or new sx he will call 911 and/or proceed to the ED. Routed to Dr. Gwenlyn Found as Juluis Rainier.

## 2014-12-30 NOTE — Telephone Encounter (Signed)
Pt c/o of Chest Pain: STAT if CP now or developed within 24 hours  1. Are you having CP right now? No  2. Are you experiencing any other symptoms (ex. SOB, nausea, vomiting, sweating)? Denies any other symptoms   3. How long have you been experiencing CP? Since yesterday afternoon(more of pressure)   4. Is your CP continuous or coming and going? Coming and going   5. Have you taken Nitroglycerin? He did not say  ?

## 2014-12-31 ENCOUNTER — Encounter: Payer: Self-pay | Admitting: Cardiology

## 2014-12-31 ENCOUNTER — Ambulatory Visit (INDEPENDENT_AMBULATORY_CARE_PROVIDER_SITE_OTHER): Payer: BLUE CROSS/BLUE SHIELD | Admitting: Cardiology

## 2014-12-31 VITALS — BP 120/84 | HR 68 | Ht 73.0 in | Wt 276.0 lb

## 2014-12-31 DIAGNOSIS — I25118 Atherosclerotic heart disease of native coronary artery with other forms of angina pectoris: Secondary | ICD-10-CM | POA: Diagnosis not present

## 2014-12-31 DIAGNOSIS — R079 Chest pain, unspecified: Secondary | ICD-10-CM | POA: Diagnosis not present

## 2014-12-31 MED ORDER — ISOSORBIDE MONONITRATE ER 30 MG PO TB24: 15.0000 mg | ORAL_TABLET | Freq: Every day | ORAL | Status: DC

## 2014-12-31 NOTE — Patient Instructions (Addendum)
Medication Instructions:  Your physician has recommended you make the following change in your medication:  1.  Start Imdur 30 mg, take 1/2 tablet daily  Labwork: None ordered   Testing/Procedures: Your physician has requested that you have An exercise stress myoview ASAP!  For further information please visit [email or web address]. Please follow instruction sheet, as given.    Follow-Up: Your physician recommends that you schedule a follow-up appointment in: 1 week with Dr. Gwenlyn Found or 1st available Extender to reacess and discuss test results.

## 2014-12-31 NOTE — Progress Notes (Signed)
12/31/2014 Angel Gutierrez   Oct 01, 1970  [id number]  Primary Physician Anselmo Pickler, DO Primary Cardiologist: Dr.Berry   Reason for Visit/CC: Chest Pain  HPI:  The patient is a 44 year old male followed Berry with a known history of coronary artery disease, prior history of tobacco abuse and strong family history of premature CAD. His coronary history dates back to 2012 when he presented with a non-STEMI. Cardiac cath then showed a 50%-60% RCA, 50-60% LAD, and a 95% circumflex at the bifurcation of the OM with a 60%-70% narrowing after the bifurcation, and a 60% narrowing in the OM. He underwent Bare metal stenting to the circumflex.   He was admitted again 01/27/14 for SSCP Pressure. Troponins were negative. Patient underwent cardiac catheterization in the morning of 01/27/2014 which showed two-vessel obstructive CAD with 80% mid RCA stenosis treated with drug-eluting stent, and a 90% focal ostial terminal left circumflex branch stenosis which was jailed by the previous stent. He had a patent stent in the left circumflex/OM 1. EF was noted to be 55-65% during cardiac cath. There was a small branch distal LCX which was not treated - it appeared similar to 2012, but Dr. Martinique mentioned this could be amenable to PTCA if he were to have anginal symptoms.  He presents back to clinic today with complaints of a 2 day history of intermittent substernal chest discomfort. Non-radiating. Mild in intensity. Described as pressure. Occurs at rest but not necessarily worse with exertion. No association with meals. He denies associated dyspnea, diaphoresis, nausea, vomiting, dizziness, syncope/near-syncope. He reports full medication compliance with all of his medications including aspirin and Effient. He's been fully compliant with his statin. He denies any further tobacco use. His symptoms have been relieved with SL NTG.   EKG in clinic today demonstrates normal sinus rhythm with no ischemic abnormalities.  Blood pressure and heart rate are both well-controlled.   Current Outpatient Prescriptions  Medication Sig Dispense Refill  . albuterol (PROVENTIL HFA;VENTOLIN HFA) 108 (90 BASE) MCG/ACT inhaler Inhale 2 puffs into the lungs every 4 (four) hours as needed for wheezing or shortness of breath.    Marland Kitchen aspirin EC 81 MG EC tablet Take 1 tablet (81 mg total) by mouth daily.    Marland Kitchen atorvastatin (LIPITOR) 40 MG tablet Take 1 tablet by mouth daily.  1  . bisoprolol (ZEBETA) 5 MG tablet Take 0.5 tablets (2.5 mg total) by mouth daily. 30 tablet 6  . buPROPion (WELLBUTRIN SR) 150 MG 12 hr tablet Take 150 mg by mouth daily.     . clobetasol cream (TEMOVATE) 6.94 % Apply 1 application topically as needed (FOR PRURITUS).   0  . famotidine (PEPCID) 20 MG tablet Take 20 mg by mouth as needed for heartburn or indigestion.    . Multiple Vitamins-Minerals (MULTIVITAMIN WITH MINERALS) tablet Take 1 tablet by mouth daily.    . nitroGLYCERIN (NITROSTAT) 0.4 MG SL tablet Place 0.4 mg under the tongue every 5 (five) minutes as needed for chest pain.    . prasugrel (EFFIENT) 10 MG TABS tablet Take 1 tablet (10 mg total) by mouth daily. 30 tablet 10  . valsartan (DIOVAN) 160 MG tablet Take 1 tablet (160 mg total) by mouth daily. 30 tablet 11   No current facility-administered medications for this visit.    Allergies  Allergen Reactions  . Eggs Or Egg-Derived Products Anaphylaxis  . Influenza Vaccines     ALLERGY TO EGGS    Social History   Social History  . Marital Status:  Married    Spouse Name: N/A  . Number of Children: N/A  . Years of Education: N/A   Occupational History  . Engineer    Social History Main Topics  . Smoking status: Former Smoker -- 1.00 packs/day for 22 years    Types: Cigarettes    Quit date: 01/28/2011  . Smokeless tobacco: Never Used     Comment: 1/2 - 1 PPD  . Alcohol Use: 0.0 oz/week    0 Standard drinks or equivalent per week     Comment: QUIT drinking 01/2014  . Drug Use:  No  . Sexual Activity: Not on file   Other Topics Concern  . Not on file   Social History Narrative     Review of Systems: General: negative for chills, fever, night sweats or weight changes.  Cardiovascular: negative for chest pain, dyspnea on exertion, edema, orthopnea, palpitations, paroxysmal nocturnal dyspnea or shortness of breath Dermatological: negative for rash Respiratory: negative for cough or wheezing Urologic: negative for hematuria Abdominal: negative for nausea, vomiting, diarrhea, bright red blood per rectum, melena, or hematemesis Neurologic: negative for visual changes, syncope, or dizziness All other systems reviewed and are otherwise negative except as noted above.    Blood pressure 120/84, pulse 68, height 6\' 1"  (1.854 m), weight 276 lb (125.193 kg).  General appearance: alert, cooperative and no distress Neck: no carotid bruit and no JVD Lungs: clear to auscultation bilaterally Heart: regular rate and rhythm, S1, S2 normal, no murmur, click, rub or gallop Extremities: no LEE Pulses: 2+ and symmetric Skin: warm and dry Neurologic: Grossly normal  EKG NSR. No ischemia.   ASSESSMENT AND PLAN:   1. Chest pain/CAD: Chest pain is concerning for possible cardiac etiology. He is currently chest pain-free and EKG shows no ischemic abnormalities. We will plan for an exercise Myoview to assess for ischemia. If abnormal, will plan for repeat cardiac catheterization. We will also add low dose Imdur  since he has had positive response with subungual nitroglycerin. Will start at 15 mg daily to avoid hypotension. Continue ASA, Effient, BB and statin.   2. HLD: continue Lipitor.    PLAN  Continue medical therapy. Add Imdur for possible angina. NST to r/o ischemia. F/u with Dr. Gwenlyn Found or APP in 1-2 weeks.   Lyda Jester PA-C 12/31/2014 9:47 AM

## 2015-01-01 ENCOUNTER — Inpatient Hospital Stay (HOSPITAL_COMMUNITY)
Admission: EM | Admit: 2015-01-01 | Discharge: 2015-01-07 | DRG: 982 | Disposition: A | Discharge status: Home / Self Care | Payer: BLUE CROSS/BLUE SHIELD | Attending: Internal Medicine | Admitting: Internal Medicine

## 2015-01-01 ENCOUNTER — Emergency Department (HOSPITAL_COMMUNITY): Payer: BLUE CROSS/BLUE SHIELD

## 2015-01-01 ENCOUNTER — Encounter (HOSPITAL_COMMUNITY): Payer: Self-pay | Admitting: Emergency Medicine

## 2015-01-01 DIAGNOSIS — J385 Laryngeal spasm: Secondary | ICD-10-CM | POA: Diagnosis not present

## 2015-01-01 DIAGNOSIS — Z87891 Personal history of nicotine dependence: Secondary | ICD-10-CM

## 2015-01-01 DIAGNOSIS — Z79899 Other long term (current) drug therapy: Secondary | ICD-10-CM

## 2015-01-01 DIAGNOSIS — I25110 Atherosclerotic heart disease of native coronary artery with unstable angina pectoris: Principal | ICD-10-CM | POA: Diagnosis present

## 2015-01-01 DIAGNOSIS — I252 Old myocardial infarction: Secondary | ICD-10-CM

## 2015-01-01 DIAGNOSIS — T79A11A Traumatic compartment syndrome of right upper extremity, initial encounter: Secondary | ICD-10-CM | POA: Diagnosis not present

## 2015-01-01 DIAGNOSIS — J45909 Unspecified asthma, uncomplicated: Secondary | ICD-10-CM | POA: Diagnosis present

## 2015-01-01 DIAGNOSIS — R609 Edema, unspecified: Secondary | ICD-10-CM

## 2015-01-01 DIAGNOSIS — Z955 Presence of coronary angioplasty implant and graft: Secondary | ICD-10-CM

## 2015-01-01 DIAGNOSIS — Z887 Allergy status to serum and vaccine status: Secondary | ICD-10-CM

## 2015-01-01 DIAGNOSIS — R079 Chest pain, unspecified: Secondary | ICD-10-CM | POA: Diagnosis present

## 2015-01-01 DIAGNOSIS — E8770 Fluid overload, unspecified: Secondary | ICD-10-CM

## 2015-01-01 DIAGNOSIS — I10 Essential (primary) hypertension: Secondary | ICD-10-CM | POA: Diagnosis present

## 2015-01-01 DIAGNOSIS — E785 Hyperlipidemia, unspecified: Secondary | ICD-10-CM | POA: Diagnosis present

## 2015-01-01 DIAGNOSIS — Z91012 Allergy to eggs: Secondary | ICD-10-CM

## 2015-01-01 DIAGNOSIS — I200 Unstable angina: Secondary | ICD-10-CM | POA: Diagnosis present

## 2015-01-01 LAB — BASIC METABOLIC PANEL
Anion gap: 7 (ref 5–15)
BUN: 9 mg/dL (ref 6–20)
CO2: 24 mmol/L (ref 22–32)
CREATININE: 1.07 mg/dL (ref 0.61–1.24)
Calcium: 9.2 mg/dL (ref 8.9–10.3)
Chloride: 104 mmol/L (ref 101–111)
GFR calc Af Amer: >60 mL/min (ref >60)
GLUCOSE: 101 mg/dL — AB (ref 65–99)
Potassium: 3.8 mmol/L (ref 3.5–5.1)
Sodium: 135 mmol/L (ref 135–145)

## 2015-01-01 LAB — CBC
HCT: 43.7 % (ref 39.0–52.0)
Hemoglobin: 14.8 g/dL (ref 13.0–17.0)
MCH: 30.9 pg (ref 26.0–34.0)
MCHC: 33.9 g/dL (ref 30.0–36.0)
MCV: 91.2 fL (ref 78.0–100.0)
PLATELETS: 236 10*3/uL (ref 150–400)
RBC: 4.79 MIL/uL (ref 4.22–5.81)
RDW: 13 % (ref 11.5–15.5)
WBC: 9.3 10*3/uL (ref 4.0–10.5)

## 2015-01-01 LAB — I-STAT TROPONIN, ED: Troponin i, poc: 0.01 ng/mL (ref 0.00–0.08)

## 2015-01-01 NOTE — ED Notes (Signed)
Pt from home for eval of substernal cp that started 2 days ago, pt states pressure in chest with no radiation. Reports he was seen by cardiology and has stress test scheduled on next Tuesday but pain has gotten worse. Pt states he took 1 nitro with minimal relief. nad noted at this time. Hx of stents and CAD.

## 2015-01-01 NOTE — ED Provider Notes (Signed)
CSN: [id number]     Arrival date & time 01/01/15  2051 History   This chart was scribed for Veryl Speak, MD by Forrestine Him, ED Scribe. This patient was seen in room B18C/B18C and the patient's care was started 11:53 PM.   Chief Complaint  Patient presents with  . Chest Pain   Patient is a 44 y.o. male presenting with chest pain. The history is provided by the patient. No language interpreter was used.  Chest Pain Pain location:  Substernal area Pain quality: pressure   Pain radiates to:  Does not radiate Pain radiates to the back: no   Pain severity:  Moderate Onset quality:  Gradual Duration:  3 days Timing:  Intermittent Progression:  Worsening Chronicity:  Recurrent Context: not lifting and no movement   Relieved by:  Nothing Worsened by:  Nothing tried Associated symptoms: no abdominal pain, no cough, no diaphoresis, no fever, no nausea, no numbness, no shortness of breath, not vomiting and no weakness   Risk factors: coronary artery disease, high cholesterol and hypertension     HPI Comments: Angel Gutierrez is a 44 y.o. male with a PMHx of HTN, hyperlipidemia, CAD, and MI who presents to the Emergency Department complaining of constant, ongoing, non-radiating substernal chest pain x 3 days. Pain is described as pressure and initially came on at rest. No aggravating or alleviating factors at this time. Prescribed Nitro attempted prior to arrival with mild temporary improvement. Denies any fever, chills, nausea, vomiting, shortness of breath, or diaphoresis. Angel Gutierrez was recently seen by Cardiology and denies any issues at time of follow up. Next cardiac stress test scheduled for next Tuesday 9/13. PSHx includes cornary stent placement 2012 and 2015. No known allergies to medications.  Past Medical History  Diagnosis Date  . Coronary artery disease     a.  s/p BMS to LCx in (2012); recent DES to RCA on 01/27/14  . Hypertension   . Hyperlipidemia   . High cholesterol   .  Myocardial infarction 10/12/2010  . Asthma    Past Surgical History  Procedure Laterality Date  . Coronary stent placement    . Myoview stress test  01/06/2011  . Left heart catheterization with coronary angiogram N/A 01/27/2014    Procedure: LEFT HEART CATHETERIZATION WITH CORONARY ANGIOGRAM;  Surgeon: Peter M Martinique, MD;  Location: [facility] CATH LAB;  Service: Cardiovascular;  Laterality: N/A;  . Percutaneous coronary stent intervention (pci-s)  01/27/2014    Procedure: PERCUTANEOUS CORONARY STENT INTERVENTION (PCI-S);  Surgeon: Peter M Martinique, MD;  Location: [facility] CATH LAB;  Service: Cardiovascular;;  . Left heart catheterization with coronary angiogram N/A 01/30/2014    Procedure: LEFT HEART CATHETERIZATION WITH CORONARY ANGIOGRAM;  Surgeon: Jettie Booze, MD;  Location: [facility] CATH LAB;  Service: Cardiovascular;  Laterality: N/A;   Family History  Problem Relation Age of Onset  . Cancer Father     pancreatic  . Heart attack Father   . Heart disease Father     CAD  . Heart attack Paternal Grandfather   . Heart disease Paternal Grandfather     CAD  . Hypertension Mother    Social History  Substance Use Topics  . Smoking status: Former Smoker -- 1.00 packs/day for 22 years    Types: Cigarettes    Quit date: 01/28/2011  . Smokeless tobacco: Never Used     Comment: 1/2 - 1 PPD  . Alcohol Use: 0.0 oz/week    0 Standard drinks or equivalent per week  Comment: QUIT drinking 01/2014    Review of Systems  Constitutional: Negative for fever, chills and diaphoresis.  Respiratory: Negative for cough and shortness of breath.   Cardiovascular: Positive for chest pain.  Gastrointestinal: Negative for nausea, vomiting and abdominal pain.  Neurological: Negative for weakness and numbness.  Psychiatric/Behavioral: Negative for confusion.  All other systems reviewed and are negative.     Allergies  Eggs or egg-derived products and Influenza vaccines  Home Medications   Prior to  Admission medications   Medication Sig Start Date End Date Taking? Authorizing Provider  albuterol (PROVENTIL HFA;VENTOLIN HFA) 108 (90 BASE) MCG/ACT inhaler Inhale 2 puffs into the lungs every 4 (four) hours as needed for wheezing or shortness of breath.    Historical Provider, MD  aspirin EC 81 MG EC tablet Take 1 tablet (81 mg total) by mouth daily. 01/28/14   Almyra Deforest, PA  atorvastatin (LIPITOR) 40 MG tablet Take 1 tablet by mouth daily. 12/10/14   Historical Provider, MD  bisoprolol (ZEBETA) 5 MG tablet Take 0.5 tablets (2.5 mg total) by mouth daily. 01/28/14   Almyra Deforest, PA  buPROPion (WELLBUTRIN SR) 150 MG 12 hr tablet Take 150 mg by mouth daily.     Historical Provider, MD  clobetasol cream (TEMOVATE) 5.95 % Apply 1 application topically as needed (FOR PRURITUS).  12/14/14   Historical Provider, MD  famotidine (PEPCID) 20 MG tablet Take 20 mg by mouth as needed for heartburn or indigestion.    Historical Provider, MD  isosorbide mononitrate (IMDUR) 30 MG 24 hr tablet Take 0.5 tablets (15 mg total) by mouth daily. 12/31/14   Brittainy Erie Noe, PA-C  Multiple Vitamins-Minerals (MULTIVITAMIN WITH MINERALS) tablet Take 1 tablet by mouth daily.    Historical Provider, MD  nitroGLYCERIN (NITROSTAT) 0.4 MG SL tablet Place 0.4 mg under the tongue every 5 (five) minutes as needed for chest pain.    Historical Provider, MD  prasugrel (EFFIENT) 10 MG TABS tablet Take 1 tablet (10 mg total) by mouth daily. 01/28/14   Almyra Deforest, PA  valsartan (DIOVAN) 160 MG tablet Take 1 tablet (160 mg total) by mouth daily. 06/08/14   Tanda Rockers, MD   Triage Vitals: BP 119/80 mmHg  Pulse 70  Temp(Src) 98.2 F (36.8 C) (Oral)  Resp 16  Ht 6\' 1"  (1.854 m)  Wt 270 lb (122.471 kg)  BMI 35.63 kg/m2  SpO2 95%   Physical Exam  Constitutional: He is oriented to person, place, and time. He appears well-developed and well-nourished.  HENT:  Head: Normocephalic and atraumatic.  Eyes: EOM are normal.  Neck: Normal range  of motion.  Cardiovascular: Normal rate, regular rhythm, normal heart sounds and intact distal pulses.   Pulmonary/Chest: Effort normal and breath sounds normal. No respiratory distress.  Abdominal: Soft. He exhibits no distension. There is no tenderness.  Musculoskeletal: Normal range of motion.  Neurological: He is alert and oriented to person, place, and time.  Skin: Skin is warm and dry.  Psychiatric: He has a normal mood and affect. Judgment normal.  Nursing note and vitals reviewed.   ED Course  Procedures (including critical care time)  DIAGNOSTIC STUDIES: Oxygen Saturation is 95% on RA, adequate by my interpretation.    COORDINATION OF CARE: 12:02 AM- Will order BMP, CBC, CXR, EKG, and i-stat troponin I. Discussed treatment plan with pt at bedside and pt agreed to plan.     Labs Review Labs Reviewed  BASIC METABOLIC PANEL - Abnormal; Notable for the following:  Glucose, Bld 101 (*)    All other components within normal limits  CBC  I-STAT TROPOININ, ED    Imaging Review Dg Chest 2 View  01/01/2015   CLINICAL DATA:  Hypertension.  Chest pain.  EXAM: CHEST  2 VIEW  COMPARISON:  01/29/2014  FINDINGS: The heart size and mediastinal contours are within normal limits. Both lungs are clear. The visualized skeletal structures are unremarkable.  IMPRESSION: No active cardiopulmonary disease.   Electronically Signed   By: Kerby Moors M.D.   On: 01/01/2015 21:43   I have personally reviewed and evaluated these images and lab results as part of my medical decision-making.  ED ECG REPORT   Date: 01/20/2015  Rate: 77  Rhythm: SR  QRS Axis: normal  Intervals: normal  ST/T Wave abnormalities: none  Conduction Disutrbances:none  Narrative Interpretation:   Old EKG Reviewed: unchanged  I have personally reviewed the EKG tracing and agree with the computerized printout as noted.   MDM   Final diagnoses:  None    Patient with history of CAD with prior stenting presents  with complaints of chest pain that is similar to prior cardiac pain.  Initial enzymes are negative and ekg is unchanged.  However, due to his prior history, will consult cardiology for likely admission.  Veryl Speak, MD 01/20/15 2027

## 2015-01-02 DIAGNOSIS — T79A0XA Compartment syndrome, unspecified, initial encounter: Secondary | ICD-10-CM | POA: Diagnosis not present

## 2015-01-02 DIAGNOSIS — I25110 Atherosclerotic heart disease of native coronary artery with unstable angina pectoris: Secondary | ICD-10-CM | POA: Diagnosis present

## 2015-01-02 DIAGNOSIS — I97618 Postprocedural hemorrhage and hematoma of a circulatory system organ or structure following other circulatory system procedure: Secondary | ICD-10-CM | POA: Diagnosis not present

## 2015-01-02 DIAGNOSIS — I252 Old myocardial infarction: Secondary | ICD-10-CM | POA: Diagnosis not present

## 2015-01-02 DIAGNOSIS — E785 Hyperlipidemia, unspecified: Secondary | ICD-10-CM | POA: Diagnosis present

## 2015-01-02 DIAGNOSIS — Z91012 Allergy to eggs: Secondary | ICD-10-CM | POA: Diagnosis not present

## 2015-01-02 DIAGNOSIS — G9340 Encephalopathy, unspecified: Secondary | ICD-10-CM | POA: Diagnosis not present

## 2015-01-02 DIAGNOSIS — J385 Laryngeal spasm: Secondary | ICD-10-CM | POA: Diagnosis not present

## 2015-01-02 DIAGNOSIS — Z887 Allergy status to serum and vaccine status: Secondary | ICD-10-CM | POA: Diagnosis not present

## 2015-01-02 DIAGNOSIS — Z955 Presence of coronary angioplasty implant and graft: Secondary | ICD-10-CM | POA: Diagnosis not present

## 2015-01-02 DIAGNOSIS — Z79899 Other long term (current) drug therapy: Secondary | ICD-10-CM | POA: Diagnosis not present

## 2015-01-02 DIAGNOSIS — Z87891 Personal history of nicotine dependence: Secondary | ICD-10-CM | POA: Diagnosis not present

## 2015-01-02 DIAGNOSIS — I200 Unstable angina: Secondary | ICD-10-CM

## 2015-01-02 DIAGNOSIS — T79A11A Traumatic compartment syndrome of right upper extremity, initial encounter: Secondary | ICD-10-CM | POA: Diagnosis not present

## 2015-01-02 DIAGNOSIS — J4520 Mild intermittent asthma, uncomplicated: Secondary | ICD-10-CM | POA: Diagnosis not present

## 2015-01-02 DIAGNOSIS — R079 Chest pain, unspecified: Secondary | ICD-10-CM | POA: Diagnosis present

## 2015-01-02 DIAGNOSIS — I209 Angina pectoris, unspecified: Secondary | ICD-10-CM | POA: Diagnosis not present

## 2015-01-02 DIAGNOSIS — J45909 Unspecified asthma, uncomplicated: Secondary | ICD-10-CM | POA: Diagnosis present

## 2015-01-02 DIAGNOSIS — I10 Essential (primary) hypertension: Secondary | ICD-10-CM | POA: Diagnosis present

## 2015-01-02 LAB — PROTIME-INR
INR: 1.12 (ref 0.00–1.49)
Prothrombin Time: 14.6 seconds (ref 11.6–15.2)

## 2015-01-02 LAB — BASIC METABOLIC PANEL
Anion gap: 9 (ref 5–15)
BUN: 7 mg/dL (ref 6–20)
CHLORIDE: 104 mmol/L (ref 101–111)
CO2: 26 mmol/L (ref 22–32)
Calcium: 9.1 mg/dL (ref 8.9–10.3)
Creatinine, Ser: 1.07 mg/dL (ref 0.61–1.24)
GFR calc Af Amer: >60 mL/min (ref >60)
GFR calc non Af Amer: >60 mL/min (ref >60)
GLUCOSE: 102 mg/dL — AB (ref 65–99)
POTASSIUM: 3.8 mmol/L (ref 3.5–5.1)
Sodium: 139 mmol/L (ref 135–145)

## 2015-01-02 LAB — TROPONIN I: Troponin I: <0.03 ng/mL (ref <0.031)

## 2015-01-02 LAB — T4, FREE: Free T4: 0.87 ng/dL (ref 0.61–1.12)

## 2015-01-02 LAB — HEPARIN LEVEL (UNFRACTIONATED)
Heparin Unfractionated: 0.44 IU/mL (ref 0.30–0.70)
Heparin Unfractionated: 0.34 IU/mL (ref 0.30–0.70)

## 2015-01-02 LAB — TSH: TSH: 2.327 u[IU]/mL (ref 0.350–4.500)

## 2015-01-02 MED ORDER — ADULT MULTIVITAMIN W/MINERALS CH
1.0000 | ORAL_TABLET | Freq: Every day | ORAL | Status: DC
  Administered 2015-01-02 – 2015-01-07 (×5): 1 via ORAL
  Filled 2015-01-02 (×5): qty 1

## 2015-01-02 MED ORDER — NITROGLYCERIN 0.4 MG SL SUBL: 0.4000 mg | SUBLINGUAL_TABLET | SUBLINGUAL | Status: DC | PRN

## 2015-01-02 MED ORDER — ALBUTEROL SULFATE HFA 108 (90 BASE) MCG/ACT IN AERS: 2.0000 | INHALATION_SPRAY | RESPIRATORY_TRACT | Status: DC | PRN

## 2015-01-02 MED ORDER — ALBUTEROL SULFATE (2.5 MG/3ML) 0.083% IN NEBU
3.0000 mL | INHALATION_SOLUTION | RESPIRATORY_TRACT | Status: DC | PRN
  Administered 2015-01-02: 3 mL via RESPIRATORY_TRACT
  Filled 2015-01-02: qty 3

## 2015-01-02 MED ORDER — ASPIRIN EC 81 MG PO TBEC
81.0000 mg | DELAYED_RELEASE_TABLET | Freq: Every day | ORAL | Status: DC
  Administered 2015-01-02 – 2015-01-07 (×6): 81 mg via ORAL
  Filled 2015-01-02 (×6): qty 1

## 2015-01-02 MED ORDER — HEPARIN BOLUS VIA INFUSION
4000.0000 [IU] | Freq: Once | INTRAVENOUS | Status: AC
  Administered 2015-01-02: 4000 [IU] via INTRAVENOUS
  Filled 2015-01-02: qty 4000

## 2015-01-02 MED ORDER — METOPROLOL TARTRATE 12.5 MG HALF TABLET
12.5000 mg | ORAL_TABLET | Freq: Two times a day (BID) | ORAL | Status: DC
  Administered 2015-01-02 – 2015-01-07 (×8): 12.5 mg via ORAL
  Filled 2015-01-02 (×13): qty 1

## 2015-01-02 MED ORDER — BUPROPION HCL ER (SR) 150 MG PO TB12
150.0000 mg | ORAL_TABLET | Freq: Every day | ORAL | Status: DC
  Administered 2015-01-02 – 2015-01-06 (×4): 150 mg via ORAL
  Filled 2015-01-02 (×5): qty 1

## 2015-01-02 MED ORDER — ACETAMINOPHEN 325 MG PO TABS
650.0000 mg | ORAL_TABLET | ORAL | Status: DC | PRN
  Administered 2015-01-02 – 2015-01-04 (×3): 650 mg via ORAL
  Filled 2015-01-02 (×4): qty 2

## 2015-01-02 MED ORDER — HEPARIN (PORCINE) IN NACL 100-0.45 UNIT/ML-% IJ SOLN
1300.0000 [IU]/h | INTRAMUSCULAR | Status: DC
  Administered 2015-01-02: 1300 [IU]/h via INTRAVENOUS
  Filled 2015-01-02 (×3): qty 250

## 2015-01-02 MED ORDER — ATORVASTATIN CALCIUM 40 MG PO TABS
40.0000 mg | ORAL_TABLET | Freq: Every day | ORAL | Status: DC
  Administered 2015-01-02 – 2015-01-07 (×6): 40 mg via ORAL
  Filled 2015-01-02 (×6): qty 1

## 2015-01-02 MED ORDER — NITROGLYCERIN 2 % TD OINT
1.0000 [in_us] | TOPICAL_OINTMENT | Freq: Once | TRANSDERMAL | Status: DC
  Filled 2015-01-02: qty 1

## 2015-01-02 MED ORDER — PRASUGREL HCL 10 MG PO TABS
10.0000 mg | ORAL_TABLET | Freq: Every day | ORAL | Status: DC
  Administered 2015-01-02 – 2015-01-07 (×5): 10 mg via ORAL
  Filled 2015-01-02 (×5): qty 1

## 2015-01-02 MED ORDER — ONDANSETRON HCL 4 MG/2ML IJ SOLN: 4.0000 mg | Freq: Four times a day (QID) | INTRAMUSCULAR | Status: DC | PRN

## 2015-01-02 NOTE — ED Notes (Signed)
Pt chest pain free at this time 

## 2015-01-02 NOTE — Progress Notes (Signed)
Patient Name: Angel Gutierrez Date of Encounter: 01/02/2015     Active Problems:   Unstable angina    SUBJECTIVE  The patient has had no further chest discomfort.  On IV nitroglycerin.  EKG shows no ischemic changes.  Enzymes negative so far.  His pain was similar to what he had previously experienced.  His last stent was in October 2015  CURRENT MEDS . aspirin EC  81 mg Oral Daily  . atorvastatin  40 mg Oral Daily  . buPROPion  150 mg Oral Daily  . metoprolol tartrate  12.5 mg Oral BID  . multivitamin with minerals  1 tablet Oral Daily  . nitroGLYCERIN  1 inch Topical Once  . prasugrel  10 mg Oral Daily    OBJECTIVE  Filed Vitals:   01/02/15 0115 01/02/15 0130 01/02/15 0211 01/02/15 0539  BP: 128/97 140/72 126/81 126/78  Pulse: 63 73 64 62  Temp:    98 F (36.7 C)  TempSrc:   Oral Oral  Resp: 23 22 21 20   Height:      Weight:   271 lb 12.8 oz (123.288 kg)   SpO2:  93% 95% 97%    Intake/Output Summary (Last 24 hours) at 01/02/15 1342 Last data filed at 01/02/15 0809  Gross per 24 hour  Intake    240 ml  Output    600 ml  Net   -360 ml   Filed Weights   01/01/15 2055 01/02/15 0211  Weight: 270 lb (122.471 kg) 271 lb 12.8 oz (123.288 kg)    PHYSICAL EXAM  General: Pleasant, NAD. Neuro: Alert and oriented X 3. Moves all extremities spontaneously. Psych: Normal affect. HEENT:  Normal  Neck: Supple without bruits or JVD. Lungs:  Resp regular and unlabored, CTA. Heart: RRR no s3, s4, or murmurs. Abdomen: Soft, non-tender, non-distended, BS + x 4.  Extremities: No clubbing, cyanosis or edema. DP/PT/Radials 2+ and equal bilaterally.  Accessory Clinical Findings  CBC  Recent Labs  01/01/15 2112  WBC 9.3  HGB 14.8  HCT 43.7  MCV 91.2  PLT 660   Basic Metabolic Panel  Recent Labs  01/01/15 2112 01/02/15 0530  NA 135 139  K 3.8 3.8  CL 104 104  CO2 24 26  GLUCOSE 101* 102*  BUN 9 7  CREATININE 1.07 1.07  CALCIUM 9.2 9.1   Liver Function  Tests No results for input(s): AST, ALT, ALKPHOS, BILITOT, PROT, ALBUMIN in the last 72 hours. No results for input(s): LIPASE, AMYLASE in the last 72 hours. Cardiac Enzymes  Recent Labs  01/02/15 0140 01/02/15 0810  TROPONINI <0.03 <0.03   BNP Invalid input(s): POCBNP D-Dimer No results for input(s): DDIMER in the last 72 hours. Hemoglobin A1C No results for input(s): HGBA1C in the last 72 hours. Fasting Lipid Panel No results for input(s): CHOL, HDL, LDLCALC, TRIG, CHOLHDL, LDLDIRECT in the last 72 hours. Thyroid Function Tests  Recent Labs  01/02/15 0530  TSH 2.327    TELE  Normal sinus rhythm  ECG  Normal sinus rhythm.  Within normal limits.  Radiology/Studies  Dg Chest 2 View  01/01/2015   CLINICAL DATA:  Hypertension.  Chest pain.  EXAM: CHEST  2 VIEW  COMPARISON:  01/29/2014  FINDINGS: The heart size and mediastinal contours are within normal limits. Both lungs are clear. The visualized skeletal structures are unremarkable.  IMPRESSION: No active cardiopulmonary disease.   Electronically Signed   By: Kerby Moors M.D.   On: 01/01/2015 21:43  ASSESSMENT AND PLAN 1.  Known three-vessel coronary disease.  Recent chest pain at rest.  Awaiting left heart catheter on Monday 2.  History of asthma  Disposition: Continue IV heparin, continue aspirin and prasugrel.  Continue beta blocker and nitrates.  Left heart cardiac catheterization Monday.  Signed, Darlin Coco MD

## 2015-01-02 NOTE — ED Notes (Signed)
Cardiology at bedside.

## 2015-01-02 NOTE — Progress Notes (Signed)
ANTICOAGULATION CONSULT NOTE - Initial Consult  Pharmacy Consult for heparin Indication: chest pain/ACS  Allergies  Allergen Reactions  . Eggs Or Egg-Derived Products Anaphylaxis  . Influenza Vaccines     ALLERGY TO EGGS    Patient Measurements: Height: 6\' 1"  (185.4 cm) Weight: 270 lb (122.471 kg) IBW/kg (Calculated) : 79.9 Heparin Dosing Weight: 106.7 kg  Vital Signs: Temp: 98.2 F (36.8 C) (09/09 2055) Temp Source: Oral (09/09 2055) BP: 128/97 mmHg (09/10 0115) Pulse Rate: 63 (09/10 0115)  Labs:  Recent Labs  01/01/15 2112  HGB 14.8  HCT 43.7  PLT 236  CREATININE 1.07    Estimated Creatinine Clearance: 122 mL/min (by C-G formula based on Cr of 1.07).   Medical History: Past Medical History  Diagnosis Date  . Coronary artery disease     a.  s/p BMS to LCx in (2012); recent DES to RCA on 01/27/14  . Hypertension   . Hyperlipidemia   . High cholesterol   . Myocardial infarction 10/12/2010  . Asthma     Assessment: 44 yo M w/ a PMH significant for 3vCAD presented with chest pain to start IV heparin. Not on anticoagulation PTA, baseline INR 0.97. hgb 14.8, plt wnl.  Goal of Therapy:  Heparin level 0.3-0.7 units/ml Monitor platelets by anticoagulation protocol: Yes   Plan:  Heparin bolus 4000 units x 1 Heparin infusion 1300 units/hr F/u heparin level at 0800 Daily heparin level and CBC  Maryanna Shape, PharmD, BCPS  Clinical Pharmacist  Pager: [phone]   01/02/2015,1:24 AM

## 2015-01-02 NOTE — Progress Notes (Signed)
Pt states his metoprolol (beta blocker) makes him SOB, pt is stable at this time. I stated to pt to inform his physician or PCP the adverse effects of that med. He stated he has. No wheeze noted, no SOB noted on arrival, pt HR is stable and o2 saturations are acceptable at 98% room air.

## 2015-01-02 NOTE — Progress Notes (Addendum)
ANTICOAGULATION CONSULT NOTE - Follow Up Consult  Pharmacy Consult for Heparin Indication: chest pain/ACS  Allergies  Allergen Reactions  . Eggs Or Egg-Derived Products Anaphylaxis  . Influenza Vaccines     ALLERGY TO EGGS  . Metoprolol Shortness Of Breath    Mild increase in SOB related to asthma    Patient Measurements: Height: 6\' 1"  (185.4 cm) Weight: 271 lb 12.8 oz (123.288 kg) IBW/kg (Calculated) : 79.9 Heparin Dosing Weight: 106.7kg  Vital Signs: Temp: 98 F (36.7 C) (09/10 0539) Temp Source: Oral (09/10 0539) BP: 126/78 mmHg (09/10 0539) Pulse Rate: 62 (09/10 0539)  Labs:  Recent Labs  01/01/15 2112 01/02/15 0140 01/02/15 0530 01/02/15 0810  HGB 14.8  --   --   --   HCT 43.7  --   --   --   PLT 236  --   --   --   LABPROT  --   --  14.6  --   INR  --   --  1.12  --   HEPARINUNFRC  --   --   --  0.44  CREATININE 1.07  --  1.07  --   TROPONINI  --  <0.03  --  <0.03    Estimated Creatinine Clearance: 122.5 mL/min (by C-G formula based on Cr of 1.07).   Medications:  Heparin @ 1300 units/hr  Assessment: 44yom w/ history of 3vCAD s/p stents to his LCx and RCA was started on heparin earlier today for chest pain. Troponins negative thus far. Initial heparin level therapeutic at 0.44. No bleeding reported. Awaiting decision for cath versus stress test.  Goal of Therapy:  Heparin level 0.3-0.7 units/ml Monitor platelets by anticoagulation protocol: Yes   Plan:  1) Continue heparin at 1300 units/hr 2) Check 6 hour heparin level to confirm  Deboraha Sprang 01/02/2015,10:38 AM   Addendum: Confirmatory heparin level is therapeutic at 0.34. Continue heparin at 1300 units/hr and follow up AM labs.  Deboraha Sprang 01/02/2015, 2:17 PM

## 2015-01-02 NOTE — H&P (Signed)
Patient ID: Angel Gutierrez MRN: [id number], DOB/AGE: July 29, 1970   Admit date: 01/01/2015   Primary Physician: Anselmo Pickler, DO Primary Cardiologist: see EPIC  Pt. Profile:  Pt is a 44 yo M w/ a PMH significant for 3vCAD and asthma p/w UA.  Problem List  Past Medical History  Diagnosis Date  . Coronary artery disease     a.  s/p BMS to LCx in (2012); recent DES to RCA on 01/27/14  . Hypertension   . Hyperlipidemia   . High cholesterol   . Myocardial infarction 10/12/2010  . Asthma     Past Surgical History  Procedure Laterality Date  . Coronary stent placement    . Myoview stress test  01/06/2011  . Left heart catheterization with coronary angiogram N/A 01/27/2014    Procedure: LEFT HEART CATHETERIZATION WITH CORONARY ANGIOGRAM;  Surgeon: Peter M Martinique, MD;  Location: [facility] CATH LAB;  Service: Cardiovascular;  Laterality: N/A;  . Percutaneous coronary stent intervention (pci-s)  01/27/2014    Procedure: PERCUTANEOUS CORONARY STENT INTERVENTION (PCI-S);  Surgeon: Peter M Martinique, MD;  Location: [facility] CATH LAB;  Service: Cardiovascular;;  . Left heart catheterization with coronary angiogram N/A 01/30/2014    Procedure: LEFT HEART CATHETERIZATION WITH CORONARY ANGIOGRAM;  Surgeon: Jettie Booze, MD;  Location: [facility] CATH LAB;  Service: Cardiovascular;  Laterality: N/A;    Allergies  Allergies  Allergen Reactions  . Eggs Or Egg-Derived Products Anaphylaxis  . Influenza Vaccines     ALLERGY TO EGGS    HPI  In extreme brevis, pt has multiple risk factor and longstanding hx of CAD. S/p BMS 2012 to LCx and DES to RCA 01/2014. Has had CP at rest for past few days. Was seen in cardiology clinic and discussed outpatient exercise stress nuclear this coming Tues. Tonight CP became unbearable so came to ER. Substernal pressure. 10/10. Lasted 45 min. No associated SOB, n/v, or diaphoresis. Denies radiation. Resolved w/ SLN X 2. Endorses medication compliance. Does not want LHC but feels  there is a blockage.  Home Medications  Prior to Admission medications   Medication Sig Start Date End Date Taking? Authorizing Provider  albuterol (PROVENTIL HFA;VENTOLIN HFA) 108 (90 BASE) MCG/ACT inhaler Inhale 2 puffs into the lungs every 4 (four) hours as needed for wheezing or shortness of breath.   Yes Historical Provider, MD  atorvastatin (LIPITOR) 40 MG tablet Take 1 tablet by mouth daily. 12/10/14  Yes Historical Provider, MD  bisoprolol (ZEBETA) 5 MG tablet Take 0.5 tablets (2.5 mg total) by mouth daily. 01/28/14  Yes Almyra Deforest, PA  buPROPion (WELLBUTRIN SR) 150 MG 12 hr tablet Take 150 mg by mouth daily.    Yes Historical Provider, MD  clobetasol cream (TEMOVATE) 6.22 % Apply 1 application topically as needed (FOR PRURITUS).  12/14/14  Yes Historical Provider, MD  Multiple Vitamins-Minerals (MULTIVITAMIN WITH MINERALS) tablet Take 1 tablet by mouth daily.   Yes Historical Provider, MD  nitroGLYCERIN (NITROSTAT) 0.4 MG SL tablet Place 0.4 mg under the tongue every 5 (five) minutes as needed for chest pain.   Yes Historical Provider, MD  prasugrel (EFFIENT) 10 MG TABS tablet Take 1 tablet (10 mg total) by mouth daily. 01/28/14  Yes Almyra Deforest, PA  valsartan (DIOVAN) 160 MG tablet Take 1 tablet (160 mg total) by mouth daily. 06/08/14  Yes Tanda Rockers, MD  aspirin EC 81 MG EC tablet Take 1 tablet (81 mg total) by mouth daily. Patient not taking: Reported on 01/02/2015 01/28/14  Almyra Deforest, [place]  isosorbide mononitrate (IMDUR) 30 MG 24 hr tablet Take 0.5 tablets (15 mg total) by mouth daily. Patient not taking: Reported on 01/02/2015 12/31/14   Brittainy Erie Noe, PA-C   Family History  Family History  Problem Relation Age of Onset  . Cancer Father     pancreatic  . Heart attack Father   . Heart disease Father     CAD  . Heart attack Paternal Grandfather   . Heart disease Paternal Grandfather     CAD  . Hypertension Mother    Social History  Social History   Social History  .  Marital Status: Married    Spouse Name: N/A  . Number of Children: N/A  . Years of Education: N/A   Occupational History  . Engineer    Social History Main Topics  . Smoking status: Former Smoker -- 1.00 packs/day for 22 years    Types: Cigarettes    Quit date: 01/28/2011  . Smokeless tobacco: Never Used     Comment: 1/2 - 1 PPD  . Alcohol Use: 0.0 oz/week    0 Standard drinks or equivalent per week     Comment: QUIT drinking 01/2014  . Drug Use: No  . Sexual Activity: Not on file   Other Topics Concern  . Not on file   Social History Narrative    Review of Systems General:  No chills, fever, night sweats or weight changes Cardiovascular:  14-point negative Dermatological: No rash, lesions/masses Respiratory: No cough, dyspnea Urologic: No hematuria, dysuria Abdominal:   No nausea, vomiting, diarrhea, bright red blood per rectum, melena, or hematemesis Neurologic:  No visual changes, wkns, changes in mental status All other systems reviewed and are otherwise negative except as noted above  Physical Exam  Blood pressure 115/75, pulse 85, temperature 98.2 F (36.8 C), temperature source Oral, resp. rate 18, height 6\' 1"  (1.854 m), weight 270 lb (122.471 kg), SpO2 94 %.  General: Pleasant, NAD Psych: Normal affect. Neuro: Alert and oriented X 3. Moves all extremities spontaneously. HEENT: Normal  Neck: Supple without bruits or JVD. Lungs:  Resp regular and unlabored, CTA. Heart: RRR no s3, s4, or murmurs. Abdomen: Soft, non-tender, non-distended, BS + x 4.  Extremities: No clubbing, cyanosis or edema. DP/PT/Radials 2+ and equal bilaterally.  Labs  Troponin [facility] Test)  Recent Labs  01/01/15 2118  TROPIPOC 0.01   No results for input(s): CKTOTAL, CKMB, TROPONINI in the last 72 hours. Lab Results  Component Value Date   WBC 9.3 01/01/2015   HGB 14.8 01/01/2015   HCT 43.7 01/01/2015   MCV 91.2 01/01/2015   PLT 236 01/01/2015    Recent Labs Lab  01/01/15 2112  NA 135  K 3.8  CL 104  CO2 24  BUN 9  CREATININE 1.07  CALCIUM 9.2  GLUCOSE 101*   Lab Results  Component Value Date   CHOL 159 01/27/2014   HDL 53 01/27/2014   LDLCALC 95 01/27/2014   TRIG 56 01/27/2014   No results found for: DDIMER   Radiology/Studies  Dg Chest 2 View  01/01/2015   CLINICAL DATA:  Hypertension.  Chest pain.  EXAM: CHEST  2 VIEW  COMPARISON:  01/29/2014  FINDINGS: The heart size and mediastinal contours are within normal limits. Both lungs are clear. The visualized skeletal structures are unremarkable.  IMPRESSION: No active cardiopulmonary disease.   Electronically Signed   By: Kerby Moors M.D.   On: 01/01/2015 21:43  ECG  NSR, no acute ischmic changes  ASSESSMENT AND PLAN Pt is a 44 yo M w/ a PMH significant for 3vCAD and asthma p/w UA.  #UA: Known 3vCAD. Multiple risk factors. Concerned that symptoms are occuring at rest. No acute ischemic EKG changes and negative troponin. Given significant hx and symptoms present at rest favor LHC on Mon over stress nuclear. -admit to telemetry -serial CE/EKG -TFTs, lipid panel, HgbA1c for risk stratification -consider repeat TTE -continue ASA/prasugrel, start heparin gtt -continue high-potency statin -start metoprolol, imdur -consider ACEI long-term  #FEN/GI -PIVB, replete lytes PRN  #PPx -heparin per ACS protocol -GI not indicated  #Dispo -pending w/u and eval  Signed, Bari Edward, MD 01/02/2015, 1:14 AM

## 2015-01-02 NOTE — Progress Notes (Signed)
PATIENT ARRIVED TO UNIT 2W FROM E.D. VIA STRETCHER. AMBULATED TO BATHROOM, THEN BED ON OWN. TELE APPLIED. VITALS OBTAINED. ASSESSMENT PERFORMED.  EDUCATED ON EQUIPMENT AND INSTRUCTED TO CALL FOR ASSISTANCE WHEN NEEDED.

## 2015-01-03 LAB — CBC
HCT: 45.2 % (ref 39.0–52.0)
HEMOGLOBIN: 15.2 g/dL (ref 13.0–17.0)
MCH: 31.3 pg (ref 26.0–34.0)
MCHC: 33.6 g/dL (ref 30.0–36.0)
MCV: 93.2 fL (ref 78.0–100.0)
PLATELETS: 220 10*3/uL (ref 150–400)
RBC: 4.85 MIL/uL (ref 4.22–5.81)
RDW: 13.2 % (ref 11.5–15.5)
WBC: 8.7 10*3/uL (ref 4.0–10.5)

## 2015-01-03 LAB — BASIC METABOLIC PANEL
ANION GAP: 9 (ref 5–15)
BUN: 9 mg/dL (ref 6–20)
CHLORIDE: 105 mmol/L (ref 101–111)
CO2: 24 mmol/L (ref 22–32)
CREATININE: 1.15 mg/dL (ref 0.61–1.24)
Calcium: 9.3 mg/dL (ref 8.9–10.3)
GFR calc non Af Amer: >60 mL/min (ref >60)
Glucose, Bld: 131 mg/dL — ABNORMAL HIGH (ref 65–99)
POTASSIUM: 4 mmol/L (ref 3.5–5.1)
SODIUM: 138 mmol/L (ref 135–145)

## 2015-01-03 LAB — HEPARIN LEVEL (UNFRACTIONATED): Heparin Unfractionated: 0.31 IU/mL (ref 0.30–0.70)

## 2015-01-03 MED ORDER — ASPIRIN 81 MG PO CHEW
81.0000 mg | CHEWABLE_TABLET | ORAL | Status: AC
  Administered 2015-01-04: 81 mg via ORAL
  Filled 2015-01-03: qty 1

## 2015-01-03 MED ORDER — SODIUM CHLORIDE 0.9 % IJ SOLN: 3.0000 mL | INTRAMUSCULAR | Status: DC | PRN

## 2015-01-03 MED ORDER — SODIUM CHLORIDE 0.9 % IJ SOLN
3.0000 mL | Freq: Two times a day (BID) | INTRAMUSCULAR | Status: DC
  Administered 2015-01-04: 3 mL via INTRAVENOUS

## 2015-01-03 MED ORDER — SODIUM CHLORIDE 0.9 % WEIGHT BASED INFUSION
1.0000 mL/kg/h | INTRAVENOUS | Status: DC
  Administered 2015-01-04: 1 mL/kg/h via INTRAVENOUS

## 2015-01-03 MED ORDER — SODIUM CHLORIDE 0.9 % WEIGHT BASED INFUSION
3.0000 mL/kg/h | INTRAVENOUS | Status: DC
  Administered 2015-01-04: 3 mL/kg/h via INTRAVENOUS

## 2015-01-03 MED ORDER — HEPARIN (PORCINE) IN NACL 100-0.45 UNIT/ML-% IJ SOLN
1400.0000 [IU]/h | INTRAMUSCULAR | Status: DC
  Administered 2015-01-03 – 2015-01-04 (×2): 1400 [IU]/h via INTRAVENOUS
  Filled 2015-01-03 (×2): qty 250

## 2015-01-03 MED ORDER — SODIUM CHLORIDE 0.9 % IV SOLN: 250.0000 mL | INTRAVENOUS | Status: DC | PRN

## 2015-01-03 NOTE — Progress Notes (Signed)
Patient Name: Angel Gutierrez Date of Encounter: 01/03/2015     Active Problems:   Unstable angina    SUBJECTIVE  The patient had some mild chest discomfort yesterday evening not requiring sublingual nitroglycerin.  He had difficulty falling asleep last night and was anxious.  CURRENT MEDS . aspirin EC  81 mg Oral Daily  . atorvastatin  40 mg Oral Daily  . buPROPion  150 mg Oral Daily  . metoprolol tartrate  12.5 mg Oral BID  . multivitamin with minerals  1 tablet Oral Daily  . nitroGLYCERIN  1 inch Topical Once  . prasugrel  10 mg Oral Daily    OBJECTIVE  Filed Vitals:   01/02/15 1403 01/02/15 2115 01/02/15 2210 01/03/15 0534  BP: 121/80 130/83  117/80  Pulse: 70 70  70  Temp: 98.3 F (36.8 C) 98.9 F (37.2 C)  98.2 F (36.8 C)  TempSrc: Oral Oral  Oral  Resp: 19 18  18   Height:      Weight:      SpO2: 98% 95% 98% 97%   No intake or output data in the 24 hours ending 01/03/15 1235 Filed Weights   01/01/15 2055 01/02/15 0211  Weight: 270 lb (122.471 kg) 271 lb 12.8 oz (123.288 kg)    PHYSICAL EXAM  General: Pleasant, NAD. Neuro: Alert and oriented X 3. Moves all extremities spontaneously. Psych: Normal affect. HEENT:  Normal  Neck: Supple without bruits or JVD. Lungs:  Resp regular and unlabored, CTA. Heart: RRR no s3, s4, or murmurs. Abdomen: Soft, non-tender, non-distended, BS + x 4.  Extremities: No clubbing, cyanosis or edema. DP/PT/Radials 2+ and equal bilaterally.  Accessory Clinical Findings  CBC  Recent Labs  01/01/15 2112 01/03/15 0416  WBC 9.3 8.7  HGB 14.8 15.2  HCT 43.7 45.2  MCV 91.2 93.2  PLT 236 295   Basic Metabolic Panel  Recent Labs  01/02/15 0530 01/03/15 0416  NA 139 138  K 3.8 4.0  CL 104 105  CO2 26 24  GLUCOSE 102* 131*  BUN 7 9  CREATININE 1.07 1.15  CALCIUM 9.1 9.3   Liver Function Tests No results for input(s): AST, ALT, ALKPHOS, BILITOT, PROT, ALBUMIN in the last 72 hours. No results for input(s):  LIPASE, AMYLASE in the last 72 hours. Cardiac Enzymes  Recent Labs  01/02/15 0140 01/02/15 0810 01/02/15 1330  TROPONINI <0.03 <0.03 <0.03   BNP Invalid input(s): POCBNP D-Dimer No results for input(s): DDIMER in the last 72 hours. Hemoglobin A1C No results for input(s): HGBA1C in the last 72 hours. Fasting Lipid Panel No results for input(s): CHOL, HDL, LDLCALC, TRIG, CHOLHDL, LDLDIRECT in the last 72 hours. Thyroid Function Tests  Recent Labs  01/02/15 0530  TSH 2.327    TELE  Normal sinus rhythm.  ECG    Radiology/Studies  Dg Chest 2 View  01/01/2015   CLINICAL DATA:  Hypertension.  Chest pain.  EXAM: CHEST  2 VIEW  COMPARISON:  01/29/2014  FINDINGS: The heart size and mediastinal contours are within normal limits. Both lungs are clear. The visualized skeletal structures are unremarkable.  IMPRESSION: No active cardiopulmonary disease.   Electronically Signed   By: Kerby Moors M.D.   On: 01/01/2015 21:43    ASSESSMENT AND PLAN  1. Known three-vessel coronary disease. Has ruled out for acute MI. Awaiting left heart catheter on Monday 2. History of asthma  Disposition: Continue IV heparin, continue aspirin and prasugrel. Continue beta blocker and nitrates. Left heart cardiac  catheterization Monday.  Orders in [place].  Signed, Darlin Coco MD

## 2015-01-03 NOTE — Progress Notes (Signed)
ANTICOAGULATION CONSULT NOTE - Follow Up Consult  Pharmacy Consult for Heparin Indication: chest pain/ACS  Allergies  Allergen Reactions  . Eggs Or Egg-Derived Products Anaphylaxis  . Influenza Vaccines     ALLERGY TO EGGS  . Metoprolol Shortness Of Breath    Mild increase in SOB related to asthma    Patient Measurements: Height: 6\' 1"  (185.4 cm) Weight: 271 lb 12.8 oz (123.288 kg) IBW/kg (Calculated) : 79.9 Heparin Dosing Weight: 106.7kg  Vital Signs: Temp: 98.2 F (36.8 C) (09/11 0534) Temp Source: Oral (09/11 0534) BP: 117/80 mmHg (09/11 0534) Pulse Rate: 70 (09/11 0534)  Labs:  Recent Labs  01/01/15 2112 01/02/15 0140 01/02/15 0530 01/02/15 0810 01/02/15 1330 01/03/15 0416  HGB 14.8  --   --   --   --  15.2  HCT 43.7  --   --   --   --  45.2  PLT 236  --   --   --   --  220  LABPROT  --   --  14.6  --   --   --   INR  --   --  1.12  --   --   --   HEPARINUNFRC  --   --   --  0.44 0.34 0.31  CREATININE 1.07  --  1.07  --   --  1.15  TROPONINI  --  <0.03  --  <0.03 <0.03  --     Estimated Creatinine Clearance: 114 mL/min (by C-G formula based on Cr of 1.15).   Medications:  Heparin @ 1300 units/hr  Assessment: 44yom w/ history of 3vCAD s/p stents to his LCx and RCA continues on heparin for chest pain with negative troponins. Heparin level is therapeutic at 0.31 but levels have overall been trending down. CBC is stable. No bleeding reported. Plan for cath on Monday  Goal of Therapy:  Heparin level 0.3-0.7 units/ml Monitor platelets by anticoagulation protocol: Yes   Plan:  1) Increase heparin to 1400 units/hr 2) Heparin level, CBC in AM 3) Cath tomorrow  Deboraha Sprang 01/03/2015,10:39 AM

## 2015-01-04 ENCOUNTER — Inpatient Hospital Stay (HOSPITAL_COMMUNITY): Payer: BLUE CROSS/BLUE SHIELD | Admitting: Anesthesiology

## 2015-01-04 ENCOUNTER — Encounter (HOSPITAL_COMMUNITY): Payer: Self-pay | Admitting: Student

## 2015-01-04 ENCOUNTER — Inpatient Hospital Stay (HOSPITAL_COMMUNITY): Payer: BLUE CROSS/BLUE SHIELD

## 2015-01-04 ENCOUNTER — Encounter (HOSPITAL_COMMUNITY)
Admission: EM | Disposition: A | Discharge status: Home / Self Care | Payer: BLUE CROSS/BLUE SHIELD | Source: Home / Self Care

## 2015-01-04 ENCOUNTER — Encounter (HOSPITAL_COMMUNITY): Admission: EM | Disposition: A | Discharge status: Home / Self Care | Payer: Self-pay | Source: Home / Self Care

## 2015-01-04 DIAGNOSIS — I97618 Postprocedural hemorrhage and hematoma of a circulatory system organ or structure following other circulatory system procedure: Secondary | ICD-10-CM

## 2015-01-04 DIAGNOSIS — I25110 Atherosclerotic heart disease of native coronary artery with unstable angina pectoris: Principal | ICD-10-CM

## 2015-01-04 DIAGNOSIS — R079 Chest pain, unspecified: Secondary | ICD-10-CM | POA: Diagnosis present

## 2015-01-04 DIAGNOSIS — E785 Hyperlipidemia, unspecified: Secondary | ICD-10-CM

## 2015-01-04 HISTORY — PX: CARDIAC CATHETERIZATION: SHX172

## 2015-01-04 HISTORY — PX: FASCIOTOMY: SHX132

## 2015-01-04 LAB — BASIC METABOLIC PANEL
Anion gap: 8 (ref 5–15)
BUN: 10 mg/dL (ref 6–20)
CHLORIDE: 103 mmol/L (ref 101–111)
CO2: 25 mmol/L (ref 22–32)
CREATININE: 1.28 mg/dL — AB (ref 0.61–1.24)
Calcium: 9.2 mg/dL (ref 8.9–10.3)
GFR calc Af Amer: >60 mL/min (ref >60)
GFR calc non Af Amer: >60 mL/min (ref >60)
GLUCOSE: 97 mg/dL (ref 65–99)
Potassium: 4.1 mmol/L (ref 3.5–5.1)
Sodium: 136 mmol/L (ref 135–145)

## 2015-01-04 LAB — CBC
HCT: 46.4 % (ref 39.0–52.0)
Hemoglobin: 15.6 g/dL (ref 13.0–17.0)
MCH: 31.1 pg (ref 26.0–34.0)
MCHC: 33.6 g/dL (ref 30.0–36.0)
MCV: 92.4 fL (ref 78.0–100.0)
PLATELETS: 250 10*3/uL (ref 150–400)
RBC: 5.02 MIL/uL (ref 4.22–5.81)
RDW: 13.2 % (ref 11.5–15.5)
WBC: 9.3 10*3/uL (ref 4.0–10.5)

## 2015-01-04 LAB — HEMOGLOBIN A1C
Hgb A1c MFr Bld: 5.7 % — ABNORMAL HIGH (ref 4.8–5.6)
MEAN PLASMA GLUCOSE: 117 mg/dL

## 2015-01-04 LAB — HEPARIN LEVEL (UNFRACTIONATED): Heparin Unfractionated: 0.37 IU/mL (ref 0.30–0.70)

## 2015-01-04 SURGERY — FASCIOTOMY, UPPER EXTREMITY
Anesthesia: General | Laterality: Right

## 2015-01-04 SURGERY — LEFT HEART CATH AND CORONARY ANGIOGRAPHY
Anesthesia: LOCAL

## 2015-01-04 MED ORDER — NEOSTIGMINE METHYLSULFATE 10 MG/10ML IV SOLN
INTRAVENOUS | Status: DC | PRN
  Administered 2015-01-04: 4 mg via INTRAVENOUS

## 2015-01-04 MED ORDER — ONDANSETRON HCL 4 MG/2ML IJ SOLN
4.0000 mg | Freq: Four times a day (QID) | INTRAMUSCULAR | Status: DC | PRN
  Administered 2015-01-04 – 2015-01-05 (×2): 4 mg via INTRAVENOUS
  Filled 2015-01-04: qty 2

## 2015-01-04 MED ORDER — HYDRALAZINE HCL 20 MG/ML IJ SOLN: 5.0000 mg | INTRAMUSCULAR | Status: DC | PRN

## 2015-01-04 MED ORDER — SODIUM CHLORIDE 0.9 % IV SOLN: 250.0000 mL | INTRAVENOUS | Status: DC | PRN

## 2015-01-04 MED ORDER — SUCCINYLCHOLINE CHLORIDE 20 MG/ML IJ SOLN
INTRAMUSCULAR | Status: AC
  Filled 2015-01-04: qty 1

## 2015-01-04 MED ORDER — DEXTROSE-NACL 5-0.45 % IV SOLN
INTRAVENOUS | Status: DC
  Administered 2015-01-04: 50 mL/h via INTRAVENOUS

## 2015-01-04 MED ORDER — BUPIVACAINE-EPINEPHRINE (PF) 0.5% -1:200000 IJ SOLN
INTRAMUSCULAR | Status: AC
  Filled 2015-01-04: qty 30

## 2015-01-04 MED ORDER — GLYCOPYRROLATE 0.2 MG/ML IJ SOLN
INTRAMUSCULAR | Status: DC | PRN
  Administered 2015-01-04: 0.4 mg via INTRAVENOUS

## 2015-01-04 MED ORDER — HEPARIN (PORCINE) IN NACL 2-0.9 UNIT/ML-% IJ SOLN
INTRAMUSCULAR | Status: AC
  Filled 2015-01-04: qty 1000

## 2015-01-04 MED ORDER — FENTANYL CITRATE (PF) 100 MCG/2ML IJ SOLN
25.0000 ug | INTRAMUSCULAR | Status: DC | PRN
  Administered 2015-01-04: 25 ug via INTRAVENOUS
  Administered 2015-01-04 (×2): 50 ug via INTRAVENOUS

## 2015-01-04 MED ORDER — ALBUTEROL SULFATE HFA 108 (90 BASE) MCG/ACT IN AERS
INHALATION_SPRAY | RESPIRATORY_TRACT | Status: DC | PRN
  Administered 2015-01-04: 2 via RESPIRATORY_TRACT

## 2015-01-04 MED ORDER — FENTANYL CITRATE (PF) 100 MCG/2ML IJ SOLN
INTRAMUSCULAR | Status: AC
  Filled 2015-01-04: qty 2

## 2015-01-04 MED ORDER — VERAPAMIL HCL 2.5 MG/ML IV SOLN
INTRAVENOUS | Status: AC
  Filled 2015-01-04: qty 2

## 2015-01-04 MED ORDER — SODIUM CHLORIDE 0.9 % IJ SOLN: 3.0000 mL | INTRAMUSCULAR | Status: DC | PRN

## 2015-01-04 MED ORDER — LIDOCAINE HCL (CARDIAC) 20 MG/ML IV SOLN
INTRAVENOUS | Status: AC
  Filled 2015-01-04: qty 5

## 2015-01-04 MED ORDER — PROPOFOL 10 MG/ML IV BOLUS
INTRAVENOUS | Status: AC
  Filled 2015-01-04: qty 20

## 2015-01-04 MED ORDER — ALBUTEROL SULFATE HFA 108 (90 BASE) MCG/ACT IN AERS
INHALATION_SPRAY | RESPIRATORY_TRACT | Status: AC
  Filled 2015-01-04: qty 6.7

## 2015-01-04 MED ORDER — LIDOCAINE HCL (PF) 1 % IJ SOLN
INTRAMUSCULAR | Status: AC
  Filled 2015-01-04: qty 30

## 2015-01-04 MED ORDER — DEXTROSE 5 % IV SOLN
1.5000 g | INTRAVENOUS | Status: DC | PRN
  Administered 2015-01-04: 1.5 g via INTRAVENOUS

## 2015-01-04 MED ORDER — LIDOCAINE HCL (CARDIAC) 20 MG/ML IV SOLN
INTRAVENOUS | Status: DC | PRN
  Administered 2015-01-04: 75 mg via INTRAVENOUS

## 2015-01-04 MED ORDER — METOPROLOL TARTRATE 25 MG PO TABS: 12.5000 mg | ORAL_TABLET | Freq: Two times a day (BID) | ORAL | Status: DC

## 2015-01-04 MED ORDER — MIDAZOLAM HCL 5 MG/5ML IJ SOLN
INTRAMUSCULAR | Status: DC | PRN
  Administered 2015-01-04: 2 mg via INTRAVENOUS

## 2015-01-04 MED ORDER — ROCURONIUM BROMIDE 100 MG/10ML IV SOLN
INTRAVENOUS | Status: DC | PRN
  Administered 2015-01-04: 30 mg via INTRAVENOUS

## 2015-01-04 MED ORDER — HYDROMORPHONE HCL 1 MG/ML IJ SOLN: 0.2500 mg | INTRAMUSCULAR | Status: DC | PRN

## 2015-01-04 MED ORDER — GUAIFENESIN-DM 100-10 MG/5ML PO SYRP: 15.0000 mL | ORAL_SOLUTION | ORAL | Status: DC | PRN

## 2015-01-04 MED ORDER — MORPHINE SULFATE (PF) 2 MG/ML IV SOLN
2.0000 mg | INTRAVENOUS | Status: DC | PRN
  Administered 2015-01-05 (×4): 2 mg via INTRAVENOUS
  Filled 2015-01-04 (×4): qty 1

## 2015-01-04 MED ORDER — FENTANYL CITRATE (PF) 100 MCG/2ML IJ SOLN
INTRAMUSCULAR | Status: AC
  Filled 2015-01-04: qty 4

## 2015-01-04 MED ORDER — MORPHINE SULFATE (PF) 4 MG/ML IV SOLN
INTRAVENOUS | Status: AC
  Administered 2015-01-04: 4 mg via INTRAVENOUS
  Filled 2015-01-04: qty 1

## 2015-01-04 MED ORDER — MIDAZOLAM HCL 2 MG/2ML IJ SOLN
INTRAMUSCULAR | Status: DC | PRN
  Administered 2015-01-04: 1 mg via INTRAVENOUS

## 2015-01-04 MED ORDER — FUROSEMIDE 10 MG/ML IJ SOLN
10.0000 mg | Freq: Once | INTRAMUSCULAR | Status: AC
  Administered 2015-01-04: 10 mg via INTRAVENOUS

## 2015-01-04 MED ORDER — PRASUGREL HCL 10 MG PO TABS
10.0000 mg | ORAL_TABLET | Freq: Every day | ORAL | Status: DC
  Filled 2015-01-04 (×2): qty 1

## 2015-01-04 MED ORDER — PROPOFOL 10 MG/ML IV BOLUS
INTRAVENOUS | Status: DC | PRN
  Administered 2015-01-04: 200 mg via INTRAVENOUS

## 2015-01-04 MED ORDER — SODIUM CHLORIDE 0.9 % WEIGHT BASED INFUSION: 3.0000 mL/kg/h | INTRAVENOUS | Status: AC

## 2015-01-04 MED ORDER — FUROSEMIDE 10 MG/ML IJ SOLN
10.0000 mg | Freq: Once | INTRAMUSCULAR | Status: AC
  Administered 2015-01-04: 10 mg via INTRAVENOUS
  Filled 2015-01-04: qty 1

## 2015-01-04 MED ORDER — CEFUROXIME SODIUM 1.5 G IJ SOLR
1.5000 g | Freq: Two times a day (BID) | INTRAMUSCULAR | Status: AC
  Administered 2015-01-05 (×2): 1.5 g via INTRAVENOUS
  Filled 2015-01-04 (×3): qty 1.5

## 2015-01-04 MED ORDER — SODIUM CHLORIDE 0.9 % IN NEBU
INHALATION_SOLUTION | RESPIRATORY_TRACT | Status: AC
  Filled 2015-01-04: qty 3

## 2015-01-04 MED ORDER — PHENOL 1.4 % MT LIQD
1.0000 | OROMUCOSAL | Status: DC | PRN
  Administered 2015-01-05 (×2): 1 via OROMUCOSAL
  Filled 2015-01-04: qty 177

## 2015-01-04 MED ORDER — LACTATED RINGERS IV SOLN
INTRAVENOUS | Status: DC | PRN
  Administered 2015-01-04 (×2): via INTRAVENOUS

## 2015-01-04 MED ORDER — FENTANYL CITRATE (PF) 100 MCG/2ML IJ SOLN
INTRAMUSCULAR | Status: DC | PRN
  Administered 2015-01-04: 100 ug via INTRAVENOUS
  Administered 2015-01-04: 150 ug via INTRAVENOUS

## 2015-01-04 MED ORDER — ACETAMINOPHEN 325 MG PO TABS: 650.0000 mg | ORAL_TABLET | ORAL | Status: DC | PRN

## 2015-01-04 MED ORDER — SUCCINYLCHOLINE CHLORIDE 20 MG/ML IJ SOLN
INTRAMUSCULAR | Status: DC | PRN
  Administered 2015-01-04: 120 mg via INTRAVENOUS

## 2015-01-04 MED ORDER — FENTANYL CITRATE (PF) 100 MCG/2ML IJ SOLN
INTRAMUSCULAR | Status: DC | PRN
  Administered 2015-01-04 (×2): 25 ug via INTRAVENOUS

## 2015-01-04 MED ORDER — ALBUTEROL SULFATE (2.5 MG/3ML) 0.083% IN NEBU
INHALATION_SOLUTION | RESPIRATORY_TRACT | Status: AC
  Filled 2015-01-04: qty 3

## 2015-01-04 MED ORDER — SODIUM CHLORIDE 0.9 % IR SOLN
Status: DC | PRN
  Administered 2015-01-04: 500 mL

## 2015-01-04 MED ORDER — HEPARIN SODIUM (PORCINE) 1000 UNIT/ML IJ SOLN
INTRAMUSCULAR | Status: DC | PRN
Start: 2015-01-04 — End: 2015-01-04
  Administered 2015-01-04: 6000 [IU] via INTRAVENOUS

## 2015-01-04 MED ORDER — MIDAZOLAM HCL 2 MG/2ML IJ SOLN
INTRAMUSCULAR | Status: AC
  Filled 2015-01-04: qty 4

## 2015-01-04 MED ORDER — LORAZEPAM 2 MG/ML IJ SOLN: 1.0000 mg | Freq: Once | INTRAMUSCULAR | Status: DC

## 2015-01-04 MED ORDER — SODIUM CHLORIDE 0.9 % IJ SOLN
3.0000 mL | Freq: Two times a day (BID) | INTRAMUSCULAR | Status: DC
  Administered 2015-01-05 – 2015-01-07 (×5): 3 mL via INTRAVENOUS

## 2015-01-04 MED ORDER — ONDANSETRON HCL 4 MG/2ML IJ SOLN
INTRAMUSCULAR | Status: AC
  Filled 2015-01-04: qty 2

## 2015-01-04 MED ORDER — HEPARIN SODIUM (PORCINE) 1000 UNIT/ML IJ SOLN
INTRAMUSCULAR | Status: AC
  Filled 2015-01-04: qty 1

## 2015-01-04 MED ORDER — ALPRAZOLAM 0.25 MG PO TABS: 0.2500 mg | ORAL_TABLET | Freq: Two times a day (BID) | ORAL | Status: DC | PRN

## 2015-01-04 MED ORDER — GLYCOPYRROLATE 0.2 MG/ML IJ SOLN
INTRAMUSCULAR | Status: AC
  Filled 2015-01-04: qty 2

## 2015-01-04 MED ORDER — 0.9 % SODIUM CHLORIDE (POUR BTL) OPTIME
TOPICAL | Status: DC | PRN
  Administered 2015-01-04: 1000 mL

## 2015-01-04 MED ORDER — FUROSEMIDE 10 MG/ML IJ SOLN
INTRAMUSCULAR | Status: AC
  Filled 2015-01-04: qty 4

## 2015-01-04 MED ORDER — DEXTROSE 5 % IV SOLN
INTRAVENOUS | Status: AC
  Filled 2015-01-04: qty 1.5

## 2015-01-04 MED ORDER — NITROGLYCERIN 1 MG/10 ML FOR IR/CATH LAB
INTRA_ARTERIAL | Status: AC
  Filled 2015-01-04: qty 10

## 2015-01-04 MED ORDER — MORPHINE SULFATE (PF) 2 MG/ML IV SOLN
4.0000 mg | Freq: Once | INTRAVENOUS | Status: AC
  Administered 2015-01-05: 3 mg via INTRAVENOUS
  Filled 2015-01-04: qty 2

## 2015-01-04 MED ORDER — NEOSTIGMINE METHYLSULFATE 10 MG/10ML IV SOLN
INTRAVENOUS | Status: AC
  Filled 2015-01-04: qty 1

## 2015-01-04 MED ORDER — FENTANYL CITRATE (PF) 250 MCG/5ML IJ SOLN
INTRAMUSCULAR | Status: AC
  Filled 2015-01-04: qty 5

## 2015-01-04 MED ORDER — VERAPAMIL HCL 2.5 MG/ML IV SOLN
INTRAVENOUS | Status: DC | PRN
  Administered 2015-01-04: 5 mL via INTRA_ARTERIAL
  Administered 2015-01-04: 3 mL via INTRA_ARTERIAL

## 2015-01-04 MED ORDER — ALPRAZOLAM 0.25 MG PO TABS
0.2500 mg | ORAL_TABLET | Freq: Two times a day (BID) | ORAL | Status: DC | PRN
  Administered 2015-01-05 – 2015-01-06 (×3): 0.25 mg via ORAL
  Filled 2015-01-04 (×3): qty 1

## 2015-01-04 MED ORDER — PROMETHAZINE HCL 25 MG/ML IJ SOLN: 6.2500 mg | INTRAMUSCULAR | Status: DC | PRN

## 2015-01-04 MED ORDER — ASPIRIN 81 MG PO CHEW: 81.0000 mg | CHEWABLE_TABLET | Freq: Every day | ORAL | Status: DC

## 2015-01-04 MED ORDER — ROCURONIUM BROMIDE 50 MG/5ML IV SOLN
INTRAVENOUS | Status: AC
  Filled 2015-01-04: qty 1

## 2015-01-04 MED ORDER — ATORVASTATIN CALCIUM 80 MG PO TABS
80.0000 mg | ORAL_TABLET | Freq: Every day | ORAL | Status: DC
  Filled 2015-01-04 (×2): qty 1

## 2015-01-04 SURGICAL SUPPLY — 11 items

## 2015-01-04 SURGICAL SUPPLY — 47 items
BANDAGE ELASTIC 4 VELCRO ST LF (GAUZE/BANDAGES/DRESSINGS) IMPLANT
BANDAGE ELASTIC 6 VELCRO ST LF (GAUZE/BANDAGES/DRESSINGS) IMPLANT
BANDAGE ESMARK 6X9 LF (GAUZE/BANDAGES/DRESSINGS) IMPLANT
BNDG ESMARK 6X9 LF (GAUZE/BANDAGES/DRESSINGS)
BNDG GAUZE ELAST 4 BULKY (GAUZE/BANDAGES/DRESSINGS) ×2 IMPLANT
CANISTER SUCTION 2500CC (MISCELLANEOUS) ×2 IMPLANT
CANNULA VESSEL 3MM 2 BLNT TIP (CANNULA) ×2 IMPLANT
CUFF TOURNIQUET SINGLE 18IN (TOURNIQUET CUFF) IMPLANT
CUFF TOURNIQUET SINGLE 24IN (TOURNIQUET CUFF) IMPLANT
CUFF TOURNIQUET SINGLE 34IN LL (TOURNIQUET CUFF) IMPLANT
CUFF TOURNIQUET SINGLE 44IN (TOURNIQUET CUFF) IMPLANT
DRAIN SNY 10X20 3/4 PERF (WOUND CARE) IMPLANT
DRAPE ORTHO SPLIT 77X108 STRL (DRAPES) ×1
DRAPE SURG ORHT 6 SPLT 77X108 (DRAPES) ×1 IMPLANT
DRSG COVADERM 4X8 (GAUZE/BANDAGES/DRESSINGS) IMPLANT
ELECT REM PT RETURN 9FT ADLT (ELECTROSURGICAL) ×2
ELECTRODE REM PT RTRN 9FT ADLT (ELECTROSURGICAL) ×1 IMPLANT
EVACUATOR SILICONE 100CC (DRAIN) IMPLANT
GAUZE SPONGE 4X4 12PLY STRL (GAUZE/BANDAGES/DRESSINGS) ×2 IMPLANT
GLOVE BIO SURGEON STRL SZ7.5 (GLOVE) ×2 IMPLANT
GLOVE BIOGEL PI IND STRL 6.5 (GLOVE) ×1 IMPLANT
GLOVE BIOGEL PI IND STRL 7.5 (GLOVE) ×1 IMPLANT
GLOVE BIOGEL PI INDICATOR 6.5 (GLOVE) ×1
GLOVE BIOGEL PI INDICATOR 7.5 (GLOVE) ×1
GOWN STRL REUS W/ TWL LRG LVL3 (GOWN DISPOSABLE) ×3 IMPLANT
GOWN STRL REUS W/TWL LRG LVL3 (GOWN DISPOSABLE) ×3
KIT BASIN OR (CUSTOM PROCEDURE TRAY) ×2 IMPLANT
KIT ROOM TURNOVER OR (KITS) ×2 IMPLANT
LIQUID BAND (GAUZE/BANDAGES/DRESSINGS) IMPLANT
NS IRRIG 1000ML POUR BTL (IV SOLUTION) ×4 IMPLANT
PACK GENERAL/GYN (CUSTOM PROCEDURE TRAY) ×2 IMPLANT
PACK UNIVERSAL I (CUSTOM PROCEDURE TRAY) IMPLANT
PAD ARMBOARD 7.5X6 YLW CONV (MISCELLANEOUS) ×4 IMPLANT
PADDING CAST COTTON 6X4 STRL (CAST SUPPLIES) IMPLANT
SPONGE GAUZE 4X4 12PLY STER LF (GAUZE/BANDAGES/DRESSINGS) ×2 IMPLANT
SPONGE SURGIFOAM ABS GEL 100 (HEMOSTASIS) IMPLANT
STAPLER VISISTAT 35W (STAPLE) IMPLANT
SUT ETHILON 3 0 PS 1 (SUTURE) IMPLANT
SUT PROLENE 5 0 C 1 24 (SUTURE) IMPLANT
SUT PROLENE 6 0 CC (SUTURE) ×2 IMPLANT
SUT VIC AB 2-0 CTX 36 (SUTURE) IMPLANT
SUT VIC AB 3-0 SH 27 (SUTURE) ×1
SUT VIC AB 3-0 SH 27X BRD (SUTURE) ×1 IMPLANT
SUT VICRYL 4-0 PS2 18IN ABS (SUTURE) ×2 IMPLANT
TAPE CLOTH SURG 4X10 WHT LF (GAUZE/BANDAGES/DRESSINGS) ×2 IMPLANT
UNDERPAD 30X30 INCONTINENT (UNDERPADS AND DIAPERS) ×2 IMPLANT
WATER STERILE IRR 1000ML POUR (IV SOLUTION) ×2 IMPLANT

## 2015-01-04 NOTE — Anesthesia Preprocedure Evaluation (Addendum)
Anesthesia Evaluation  Patient identified by MRN, date of birth, ID band Patient awake    Reviewed: Allergy & Precautions, NPO status , Patient's Chart, lab work & pertinent test results  Airway Mallampati: II  TM Distance: >3 FB Neck ROM: Full    Dental   Pulmonary shortness of breath, asthma , former smoker,    breath sounds clear to auscultation       Cardiovascular hypertension, + angina + CAD and + Past MI   Rhythm:Regular Rate:Normal     Neuro/Psych    GI/Hepatic negative GI ROS, Neg liver ROS,   Endo/Other    Renal/GU negative Renal ROS     Musculoskeletal   Abdominal   Peds  Hematology   Anesthesia Other Findings   Reproductive/Obstetrics                           Anesthesia Physical Anesthesia Plan  ASA: III and emergent  Anesthesia Plan: General   Post-op Pain Management:    Induction: Intravenous, Rapid sequence and Cricoid pressure planned  Airway Management Planned: Oral ETT  Additional Equipment:   Intra-op Plan:   Post-operative Plan: Possible Post-op intubation/ventilation  Informed Consent: I have reviewed the patients History and Physical, chart, labs and discussed the procedure including the risks, benefits and alternatives for the proposed anesthesia with the patient or authorized representative who has indicated his/her understanding and acceptance.   Dental advisory given  Plan Discussed with: CRNA and Anesthesiologist  Anesthesia Plan Comments:        Anesthesia Quick Evaluation

## 2015-01-04 NOTE — Progress Notes (Signed)
Pt with increased forearm swelling also complaining of numbness and tingling in digits 4/5.  Firm area over ulnar aspect of forearm.   Will take to OR for exploration of radial artery possible fasciotomy.  Procedure risks benefits discussed with pt  Ruta Hinds, MD [facility] Office: [phone] Pager: [phone]

## 2015-01-04 NOTE — OR Nursing (Signed)
Pt extubated by Dr. Oletta Lamas with RT at bedside. Nasal cannula placed at 2 lpm

## 2015-01-04 NOTE — Progress Notes (Signed)
Client states right wrist swollen and states just started hurting and Rhonda,PA in to see client

## 2015-01-04 NOTE — Progress Notes (Signed)
ANTICOAGULATION CONSULT NOTE - Follow Up Consult  Pharmacy Consult for Heparin Indication: chest pain/ACS  Allergies  Allergen Reactions  . Eggs Or Egg-Derived Products Anaphylaxis  . Influenza Vaccines     ALLERGY TO EGGS  . Metoprolol Shortness Of Breath    Mild increase in SOB related to asthma    Patient Measurements: Height: 6\' 1"  (185.4 cm) Weight: 271 lb 12.8 oz (123.288 kg) IBW/kg (Calculated) : 79.9 Heparin Dosing Weight: 106.7kg  Vital Signs: Temp: 97.7 F (36.5 C) (09/12 0329) Temp Source: Oral (09/12 0329) BP: 128/77 mmHg (09/12 0329) Pulse Rate: 60 (09/12 0329)  Labs:  Recent Labs  01/01/15 2112 01/02/15 0140 01/02/15 0530  01/02/15 0810 01/02/15 1330 01/03/15 0416 01/04/15 0329  HGB 14.8  --   --   --   --   --  15.2 15.6  HCT 43.7  --   --   --   --   --  45.2 46.4  PLT 236  --   --   --   --   --  220 250  LABPROT  --   --  14.6  --   --   --   --   --   INR  --   --  1.12  --   --   --   --   --   HEPARINUNFRC  --   --   --   < > 0.44 0.34 0.31 0.37  CREATININE 1.07  --  1.07  --   --   --  1.15 1.28*  TROPONINI  --  <0.03  --   --  <0.03 <0.03  --   --   < > = values in this interval not displayed.  Estimated Creatinine Clearance: 102.4 mL/min (by C-G formula based on Cr of 1.28).   Medications:  Heparin @ 1400 units/hr  Assessment: 44yom w/ history of 3vCAD s/p stents to his LCx and RCA continues on heparin for chest pain with negative troponins. Heparin level is therapeutic at 0.37. CBC is stable. No bleeding reported. Plan for cath today.  Goal of Therapy:  Heparin level 0.3-0.7 units/ml Monitor platelets by anticoagulation protocol: Yes   Plan:  1) Continue heparin gtt at 1400 units/hr 2) Daily heparin level and CBC 3) F/u cath  Maite Burlison, Rande Lawman 01/04/2015,7:54 AM

## 2015-01-04 NOTE — Consult Note (Signed)
PULMONARY / CRITICAL CARE MEDICINE   Name: Angel Gutierrez MRN: [id number] DOB: 1970-09-17    ADMISSION DATE:  01/01/2015 CONSULTATION DATE:  01/04/2015  REFERRING MD :  Gwenlyn Found  CHIEF COMPLAINT:  Chest pain  INITIAL PRESENTATION:  44 y.o. M with hx of CAD s/p multiple stents, admitted 9/10 with unstable angina.  Underwent LHC 9/12 via right radial artery which was complicated by right forearm compartment syndrome.  He was taken to the OR for right forearm fasciotomy.  Post procedure, he was extubated but developed distress felt to be due to laryngospasm and negative pressure edema.  He was re-intubated and PCCM was consulted for vent management.   STUDIES:  LHC 9/12 >>> non-obstructive disease to LAD, patent stents in CFX and RCA.  SIGNIFICANT EVENTS: 9/10 - admitted 9/12 - LHC via R radial artery > compartment syndrome right forearm > to OR for fasciotomy > failed extubation in PACU so re-intubated> later re-extubated   HISTORY OF PRESENT ILLNESS:  Pt is encephalopathic; therefore, this HPI is obtained from chart review. Angel Gutierrez is a 44 y.o. M with PMH as outlined below.  He was admitted 9/10 for chest pain at rest that had been present for a few days prior to presentation, but became unbearable on day of presentation, rated 10/10 in substernal area.  He has longstanding hx of CAD and is s/p BMS 2012 to LCx and DES to RCA in 2015.  Due to his risk factors, history, and symptoms at rest, he was admitted with plans for [facility] on 9/12.  On 9/12, he underwent LHC via right radial artery (performed by Dr. Gwenlyn Found).  Cardiac cath showed nonobstructive disease in the LAD and patent stents in the CFX and RCA.  EF was normal and after Dr. Gwenlyn Found reviewed the films, he felt that his symptoms were likely non-cardiac in origin.  A few hours after cardiac cath, pt began to complain of pain and numbness to the right arm.  He was evaluated by cardiology and vascular surgery.  He was found to have a hematoma  just distal to the elbow and after symptoms worsened despite pressure being applied, he was taken to the OR by Dr. Oneida Alar with vascular surgery for exploration of the right radial artery with possible fasciotomy.  In OR, he was found to have compartment syndrome of the right forearm and subsequently underwent right forearm fasciotomy.  He was extubated in the PACU; however, he developed distress and was felt to have laryngeal spasm and negative pressure edema so was therefore re-intubated.  PCCM was consulted for vent management.   Of note, pt has been under significant amount of stress as his mother in law just passed away from PE some 48 hours ago.   PAST MEDICAL HISTORY :   has a past medical history of Coronary artery disease; Hypertension; Hyperlipidemia; Myocardial infarction (10/12/2010); and Asthma.  has past surgical history that includes Coronary stent placement (09/2010); left heart catheterization with coronary angiogram (N/A, 01/27/2014); percutaneous coronary stent intervention (pci-s) (01/27/2014); left heart catheterization with coronary angiogram (N/A, 01/30/2014); and Cardiac catheterization (01/04/2015). Prior to Admission medications   Medication Sig Start Date End Date Taking? Authorizing Provider  albuterol (PROVENTIL HFA;VENTOLIN HFA) 108 (90 BASE) MCG/ACT inhaler Inhale 2 puffs into the lungs every 4 (four) hours as needed for wheezing or shortness of breath.   Yes Historical Provider, MD  atorvastatin (LIPITOR) 40 MG tablet Take 1 tablet by mouth daily. 12/10/14  Yes Historical Provider, MD  bisoprolol (ZEBETA) 5  MG tablet Take 0.5 tablets (2.5 mg total) by mouth daily. 01/28/14  Yes Almyra Deforest, PA  buPROPion (WELLBUTRIN SR) 150 MG 12 hr tablet Take 150 mg by mouth daily.    Yes Historical Provider, MD  clobetasol cream (TEMOVATE) 4.03 % Apply 1 application topically as needed (FOR PRURITUS).  12/14/14  Yes Historical Provider, MD  Multiple Vitamins-Minerals (MULTIVITAMIN WITH  MINERALS) tablet Take 1 tablet by mouth daily.   Yes Historical Provider, MD  nitroGLYCERIN (NITROSTAT) 0.4 MG SL tablet Place 0.4 mg under the tongue every 5 (five) minutes as needed for chest pain.   Yes Historical Provider, MD  prasugrel (EFFIENT) 10 MG TABS tablet Take 1 tablet (10 mg total) by mouth daily. 01/28/14  Yes Almyra Deforest, PA  valsartan (DIOVAN) 160 MG tablet Take 1 tablet (160 mg total) by mouth daily. 06/08/14  Yes Tanda Rockers, MD  ALPRAZolam Duanne Moron) 0.25 MG tablet Take 1 tablet (0.25 mg total) by mouth 2 (two) times daily as needed for anxiety. 01/04/15   Evelene Croon Barrett, PA-C  aspirin EC 81 MG EC tablet Take 1 tablet (81 mg total) by mouth daily. Patient not taking: Reported on 01/02/2015 01/28/14   Almyra Deforest, PA   Allergies  Allergen Reactions  . Eggs Or Egg-Derived Products Anaphylaxis  . Influenza Vaccines     ALLERGY TO EGGS  . Metoprolol Shortness Of Breath    Mild increase in SOB related to asthma    FAMILY HISTORY:  Family History  Problem Relation Age of Onset  . Cancer Father     pancreatic  . Heart attack Father   . Heart disease Father     CAD  . Heart attack Paternal Grandfather   . Heart disease Paternal Grandfather     CAD  . Hypertension Mother     SOCIAL HISTORY:  reports that he quit smoking about 3 years ago. His smoking use included Cigarettes. He has a 22 pack-year smoking history. He has never used smokeless tobacco. He reports that he drinks alcohol. He reports that he does not use illicit drugs.  REVIEW OF SYSTEMS:   Gen: Denies fever, chills, weight change, fatigue, night sweats HEENT: Denies blurred vision, double vision, hearing loss, tinnitus, sinus congestion, rhinorrhea, sore throat, neck stiffness, dysphagia PULM: Denies shortness of breath, cough, sputum production, hemoptysis, wheezing CV: Denies chest pain, edema, orthopnea, paroxysmal nocturnal dyspnea, palpitations GI: Denies abdominal pain, nausea, vomiting, diarrhea,  hematochezia, melena, constipation, change in bowel habits GU: Denies dysuria, hematuria, polyuria, oliguria, urethral discharge Endocrine: Denies hot or cold intolerance, polyuria, polyphagia or appetite change Derm: Denies rash, dry skin, scaling or peeling skin change Heme: Denies easy bruising, bleeding, bleeding gums Neuro: Denies headache, numbness, weakness, slurred speech, loss of memory or consciousness   SUBJECTIVE:   VITAL SIGNS: Temp:  [97.6 F (36.4 C)-97.7 F (36.5 C)] 97.7 F (36.5 C) (09/12 2052) Pulse Rate:  [55-126] 74 (09/12 2130) Resp:  [4-38] 36 (09/12 1522) BP: (79-190)/(53-116) 190/114 mmHg (09/12 2107) SpO2:  [91 %-100 %] 100 % (09/12 2130) FiO2 (%):  [50 %-100 %] 50 % (09/12 2130) HEMODYNAMICS:   VENTILATOR SETTINGS: Vent Mode:  [-] PRVC FiO2 (%):  [50 %-100 %] 50 % Set Rate:  [14 bmp] 14 bmp Vt Set:  [640 mL] 640 mL PEEP:  [5 cmH20] 5 cmH20 Plateau Pressure:  [21 cmH20] 21 cmH20 INTAKE / OUTPUT: Intake/Output      09/12 0701 - 09/13 0700   P.O. 0   I.V. (  mL/kg) 1250 (10.1)   Total Intake(mL/kg) 1250 (10.1)   Urine (mL/kg/hr)    Stool    Blood 50 (0)   Total Output 50   Net +1200         PHYSICAL EXAMINATION: General: sitting up, comfortable HENT: NCAT OP clear PULM: CTA B CV: RRR, no mgr GI: BS+, soft, nontender MSK: normal bulk, tone; can move R hand without difficulty Neuro: A&OX4 sensation intact in digits of R hand  LABS:  CBC  Recent Labs Lab 01/01/15 2112 01/03/15 0416 01/04/15 0329  WBC 9.3 8.7 9.3  HGB 14.8 15.2 15.6  HCT 43.7 45.2 46.4  PLT 236 220 250   Coag's  Recent Labs Lab 01/02/15 0530  INR 1.12   BMET  Recent Labs Lab 01/02/15 0530 01/03/15 0416 01/04/15 0329  NA 139 138 136  K 3.8 4.0 4.1  CL 104 105 103  CO2 26 24 25   BUN 7 9 10   CREATININE 1.07 1.15 1.28*  GLUCOSE 102* 131* 97   Electrolytes  Recent Labs Lab 01/02/15 0530 01/03/15 0416 01/04/15 0329  CALCIUM 9.1 9.3 9.2    Sepsis Markers No results for input(s): LATICACIDVEN, PROCALCITON, O2SATVEN in the last 168 hours. ABG No results for input(s): PHART, PCO2ART, PO2ART in the last 168 hours. Liver Enzymes No results for input(s): AST, ALT, ALKPHOS, BILITOT, ALBUMIN in the last 168 hours. Cardiac Enzymes  Recent Labs Lab 01/02/15 0140 01/02/15 0810 01/02/15 1330  TROPONINI <0.03 <0.03 <0.03   Glucose No results for input(s): GLUCAP in the last 168 hours.  Imaging No results found.   ASSESSMENT / PLAN:  PULMONARY OETT 9/12 >>> A: VDRF following right forearm fasciotomy for compartment syndrome s/p LHC via right radial artery 9/12 > resolved 9/12 Hx asthma but not in exacerbation Former smoker P:   Albuterol PRN  CARDIOVASCULAR A:  Chest pain / unstable angina - s/p LHC 9/12 with no significant findings (Dr. Gwenlyn Found). Hx CAD - s/p BMS 2012 to LCx and DES to RCA in 2015. Hx HTN, HLD. P:  Post op management per vascular Cards following, continue ASA, statin, BB, effient, heparin, nitro.  RENAL A:  No acute issues  P: Monitor BMET and UOP Replace electrolytes as needed   Advance diet D/c foley Transfer out of ICU   PCCM off  Roselie Awkward, MD [place] [facility] Pager: [phone] Cell: [phone] After 3pm or if no response, call [phone]

## 2015-01-04 NOTE — Transfer of Care (Signed)
Immediate Anesthesia Transfer of Care Note  Patient: Angel Gutierrez  Procedure(s) Performed: Procedure(s): Left Forearm Fasciotomy (Right)  Patient Location: PACU  Anesthesia Type:General  Level of Consciousness: sedated and Patient remains intubated per anesthesia plan  Airway & Oxygen Therapy: Patient remains intubated per anesthesia plan and Patient placed on Ventilator (see vital sign flow sheet for setting)  Post-op Assessment: Report given to RN and Post -op Vital signs reviewed and stable  Post vital signs: Reviewed and stable  Last Vitals:  Filed Vitals:   01/04/15 2052  BP:   Pulse:   Temp: 36.5 C  Resp:     Complications: No apparent anesthesia complications

## 2015-01-04 NOTE — Anesthesia Procedure Notes (Signed)
Procedure Name: Intubation Date/Time: 01/04/2015 7:41 PM Performed by: Daysen Gundrum S Pre-anesthesia Checklist: Patient identified, Timeout performed, Emergency Drugs available, Suction available and Patient being monitored Patient Re-evaluated:Patient Re-evaluated prior to inductionOxygen Delivery Method: Circle system utilized Preoxygenation: Pre-oxygenation with 100% oxygen Intubation Type: IV induction, Rapid sequence and Cricoid Pressure applied Ventilation: Mask ventilation without difficulty Laryngoscope Size: Mac and 4 Grade View: Grade I Tube type: Oral Tube size: 8.0 mm Number of attempts: 1 Airway Equipment and Method: Stylet Placement Confirmation: ETT inserted through vocal cords under direct vision,  positive ETCO2 and breath sounds checked- equal and bilateral Secured at: 22 cm Tube secured with: Tape Dental Injury: Teeth and Oropharynx as per pre-operative assessment

## 2015-01-04 NOTE — Progress Notes (Signed)
Dr Gwenlyn Found in to see client; Aaron Edelman from cath lab in to see client

## 2015-01-04 NOTE — Op Note (Signed)
Procedure: Right forearm fasciotomy  Preoperative diagnosis: Compartment syndrome right forearm postoperative diagnosis: Same  Anesthesia: Gen.  Operative findings: Volar forearm muscles viable small amount of hematoma and muscle staining but viable contractile muscle  Operative details: After obtaining informed consent, the patient was taken the operating room. The patient was placed in supine position operating room table. After induction general anesthesia and endotracheal intubation, the patient's entire right upper extremity was prepped and draped usual sterile fashion. Next a longitudinal incision was made on the volar aspect of the right forearm in the fullest-most edematous dissection which was in the proximal half of the forearm. Incision was carried down through the subcutaneous tissues down level of fascia. The fascia was incised for the full length incision. There was significant muscle bulged immediately. There was a small amount of old dark colored blood which was evacuated. There was no obvious active bleeding. I explored the deeper compartments of the forearm all the way down to the radius and ulna interosseous membrane no further hematoma was evacuated. The muscle was stained with blood in the deeper compartments but there was no active ongoing bleeding only a small amount of dark blood. The patient had an easy bleeding palpable radial pulse at the wrist and no hematoma in this location and I did not feel that the radial artery needed to be explored.  The entire volar aspect of the forearm was well decompressed through this incision. The dorsal/anterior compartments of the forearm did not feel tense at this point. At this point a wet to dry Kerlix dressing was applied to the forearm opening the edges of the incision were loosely approximated with staples but under no circumstances was there tension on the skin edges.  The incision was approximately 6 inches in length. The central 4 inches  of the incision was left open due to tension on the skin edges. The patient tolerated the procedure well and there were no complications. The patient was taken to the recovery room in stable condition.  Ruta Hinds, MD [facility] [place] Office: [phone] Pager: [phone]

## 2015-01-04 NOTE — Progress Notes (Signed)
Complaint of pain again in right arm and Dr Gwenlyn Found and Dr Oneida Alar notified; Aaron Edelman and Barbaraann Rondo in to see client

## 2015-01-04 NOTE — Progress Notes (Signed)
Checked Angel Gutierrez right arm and radial cath site. Large hematoma present distal to elbow. Manual pressure applied until a manual blood pressure cuff was obtained. Blood pressure cuff inflated below systolic pressure at hematoma site for 10 minutes maintaining o2 sat pleth waveform . Cuff moved distal toward tr band and wrist and reinflated for 15 minutes maintaining a pleth waveform. Dr. Gwenlyn Found in to see patient. Blood pressure cuff removed as Dr. Oneida Alar arrived for assessment.  Hematoma softer but still present. Site visually check and palpated. No further growth of hematoma noted. Dr. Oneida Alar back to re-assess right arm.

## 2015-01-04 NOTE — Discharge Summary (Signed)
CARDIOLOGY DISCHARGE SUMMARY   Patient ID: Angel Gutierrez MRN: [id number] DOB/AGE: December 01, 1970 44 y.o.  Admit date: 01/01/2015 Discharge date: 01/04/2015  PCP: Anselmo Pickler, DO Primary Cardiologist: Dr Gwenlyn Found  Primary Discharge Diagnosis:  Unstable angina - medical therapy for CAD Secondary Discharge Diagnosis:    Essential hypertension   Hyperlipidemia   S/P coronary artery stent placement  Procedures: Cardiac catheterization, coronary arteriogram, left ventriculogram  Hospital Course: Angel Gutierrez is a 44 y.o. male with a history of non-STEMI 2012 with BMS to the CFX, DES to the RCA in 2015 and preserved left ventricular function. He had chest pain and came to the hospital where he was admitted for further evaluation and treatment.  His cardiac enzymes were negative for MI. His other labs did not show any critical abnormalities. He was pain-free on medical therapy including aspirin, Effient, statin and beta blocker.  His symptoms were concerning for unstable angina so he was taken to the cath lab on 01/04/2015. Cardiac catheterization showed nonobstructive disease in the LAD and patent stents in the circumflex and RCA. His EF is normal. Dr. Gwenlyn Found evaluated the films and felt that his symptoms were most likely noncardiac in origin.   Of note, the patient is under significant stress because his mother-in-law died in the last 48 hours. He was given a short-term prescription present but is to follow-up with primary care any additional medical therapy is needed. Dr. Gwenlyn Found recommended continued medical therapy for CAD and discharged with outpatient follow-up arranged.  Labs:   Lab Results  Component Value Date   WBC 9.3 01/04/2015   HGB 15.6 01/04/2015   HCT 46.4 01/04/2015   MCV 92.4 01/04/2015   PLT 250 01/04/2015     Recent Labs Lab 01/04/15 0329  NA 136  K 4.1  CL 103  CO2 25  BUN 10  CREATININE 1.28*  CALCIUM 9.2  GLUCOSE 97    Recent Labs  01/02/15 0140  01/02/15 0810 01/02/15 1330  TROPONINI <0.03 <0.03 <0.03    Recent Labs  01/02/15 0530  INR 1.12      Radiology: Dg Chest 2 View 01/01/2015   CLINICAL DATA:  Hypertension.  Chest pain.  EXAM: CHEST  2 VIEW  COMPARISON:  01/29/2014  FINDINGS: The heart size and mediastinal contours are within normal limits. Both lungs are clear. The visualized skeletal structures are unremarkable.  IMPRESSION: No active cardiopulmonary disease.   Electronically Signed   By: Kerby Moors M.D.   On: 01/01/2015 21:43    Cardiac Cath: 01/04/2015 IMPRESSION:widely patent stents with no significant CAD and normal LV function. There are no upper vessels. I believe his chest pain is noncardiac. He the procedure was done radially. He received weight-based heparin and radial cocktail via side-arm sheath. The sheath was removed and a TR band was placed on the right wrist to achieve patent hemostasis. The patient left the lab in stable condition. He'll be discharged home this afternoon and will follow-up with me in the office in several weeks.  EKG: None 11 2016 Sinus rhythm, no acute ischemic changes Vent. rate 71 BPM PR interval 158 ms QRS duration 84 ms QT/QTc 392/425 ms P-R-T axes 48 35 50  FOLLOW UP PLANS AND APPOINTMENTS Allergies  Allergen Reactions  . Eggs Or Egg-Derived Products Anaphylaxis  . Influenza Vaccines     ALLERGY TO EGGS  . Metoprolol Shortness Of Breath    Mild increase in SOB related to asthma     Medication List  STOP taking these medications        isosorbide mononitrate 30 MG 24 hr tablet  Commonly known as:  IMDUR      TAKE these medications        albuterol 108 (90 BASE) MCG/ACT inhaler  Commonly known as:  PROVENTIL HFA;VENTOLIN HFA  Inhale 2 puffs into the lungs every 4 (four) hours as needed for wheezing or shortness of breath.     ALPRAZolam 0.25 MG tablet  Commonly known as:  XANAX  Take 1 tablet (0.25 mg total) by mouth 2 (two) times daily as needed for  anxiety.     aspirin 81 MG EC tablet  Take 1 tablet (81 mg total) by mouth daily.     atorvastatin 40 MG tablet  Commonly known as:  LIPITOR  Take 1 tablet by mouth daily.     bisoprolol 5 MG tablet  Commonly known as:  ZEBETA  Take 0.5 tablets (2.5 mg total) by mouth daily.     buPROPion 150 MG 12 hr tablet  Commonly known as:  WELLBUTRIN SR  Take 150 mg by mouth daily.     clobetasol cream 0.05 %  Commonly known as:  TEMOVATE  Apply 1 application topically as needed (FOR PRURITUS).     multivitamin with minerals tablet  Take 1 tablet by mouth daily.     nitroGLYCERIN 0.4 MG SL tablet  Commonly known as:  NITROSTAT  Place 0.4 mg under the tongue every 5 (five) minutes as needed for chest pain.     prasugrel 10 MG Tabs tablet  Commonly known as:  EFFIENT  Take 1 tablet (10 mg total) by mouth daily.     valsartan 160 MG tablet  Commonly known as:  DIOVAN  Take 1 tablet (160 mg total) by mouth daily.        Discharge Instructions    Diet - low sodium heart healthy    Complete by:  As directed      Increase activity slowly    Complete by:  As directed           Follow-up Information    Follow up with Quay Burow, MD.   Specialties:  Cardiology, Radiology   Why:  The office will call.   Contact information:   [address] [place] [phone]       BRING ALL MEDICATIONS WITH YOU TO FOLLOW UP APPOINTMENTS  Time spent with patient to include physician time: 33 min Signed: Rosaria Ferries, PA-C 01/04/2015, 4:50 PM Co-Sign MD

## 2015-01-04 NOTE — Interval H&P Note (Signed)
Cath Lab Visit (complete for each Cath Lab visit)  Clinical Evaluation Leading to the Procedure:   ACS: Yes.    Non-ACS:    Anginal Classification: CCS IV  Anti-ischemic medical therapy: No Therapy  Non-Invasive Test Results: No non-invasive testing performed  Prior CABG: No previous CABG      History and Physical Interval Note:  01/04/2015 2:49 PM  Angel Gutierrez  has presented today for surgery, with the diagnosis of unstable angina  The various methods of treatment have been discussed with the patient and family. After consideration of risks, benefits and other options for treatment, the patient has consented to  Procedure(s): Left Heart Cath and Coronary Angiography (N/A) as a surgical intervention .  The patient's history has been reviewed, patient examined, no change in status, stable for surgery.  I have reviewed the patient's chart and labs.  Questions were answered to the patient's satisfaction.     Quay Burow

## 2015-01-04 NOTE — Procedures (Signed)
Extubation Procedure Note  Patient Details:   Name: Angel Gutierrez DOB: 11-01-1970 MRN: [id number]   Airway Documentation:   Patient extubated to nasal cannula by anesthesiologist.  Tolerated well, no complications.    Evaluation  O2 sats: stable throughout Complications: No apparent complications Patient did tolerate procedure well. Bilateral Breath Sounds: Rhonchi Suctioning: Airway Yes  [name], Elwyn Lade 01/04/2015, 9:51 PM

## 2015-01-04 NOTE — Consult Note (Signed)
[facility] [place] HISTORY AND PHYSICAL   History of Present Illness:  Patient is a 44 y.o. year old male who presents for evaluation of right forearm pain and swelling.  Pt with right radial artery cath earlier today.  Uneventful LAD stent by Dr Gwenlyn Found. Pt denies numbness or tingling in hand or loss of motor function. He received heparin periprocedure but is not currently on anticoagulation. Other medical problems include hypertension hyperlipidemia and asthma all of which are controlled.  Past Medical History  Diagnosis Date  . Coronary artery disease     a.  s/p BMS to LCx in (2012); DES to RCA on 01/27/14  . Hypertension   . Hyperlipidemia   . Myocardial infarction 10/12/2010  . Asthma     Past Surgical History  Procedure Laterality Date  . Coronary stent placement  09/2010    BMS to the CFX  . Left heart catheterization with coronary angiogram N/A 01/27/2014    Procedure: LEFT HEART CATHETERIZATION WITH CORONARY ANGIOGRAM;  Surgeon: Peter M Martinique, MD;  Location: [facility] CATH LAB;  Service: Cardiovascular;  Laterality: N/A;  . Percutaneous coronary stent intervention (pci-s)  01/27/2014    Procedure: PERCUTANEOUS CORONARY STENT INTERVENTION (PCI-S);  Surgeon: Peter M Martinique, MD;  Location: [facility] CATH LAB;  Service: Cardiovascular;;  . Left heart catheterization with coronary angiogram N/A 01/30/2014    Procedure: LEFT HEART CATHETERIZATION WITH CORONARY ANGIOGRAM;  Surgeon: Jettie Booze, MD;  Location: [facility] CATH LAB;  Service: Cardiovascular;  Laterality: N/A;  . Cardiac catheterization  01/04/2015    Patent stents in the RCA and CFX, LAD 35%, preserved LV function    Social History Social History  Substance Use Topics  . Smoking status: Former Smoker -- 1.00 packs/day for 22 years    Types: Cigarettes    Quit date: 01/28/2011  . Smokeless tobacco: Never Used     Comment: 1/2 - 1 PPD  . Alcohol Use: 0.0 oz/week    0 Standard drinks or equivalent per week   Comment: QUIT drinking 01/2014    Family History Family History  Problem Relation Age of Onset  . Cancer Father     pancreatic  . Heart attack Father   . Heart disease Father     CAD  . Heart attack Paternal Grandfather   . Heart disease Paternal Grandfather     CAD  . Hypertension Mother     Allergies  Allergies  Allergen Reactions  . Eggs Or Egg-Derived Products Anaphylaxis  . Influenza Vaccines     ALLERGY TO EGGS  . Metoprolol Shortness Of Breath    Mild increase in SOB related to asthma     Current Facility-Administered Medications  Medication Dose Route Frequency Provider Last Rate Last Dose  . 0.9 %  sodium chloride infusion  250 mL Intravenous PRN Darlin Coco, MD      . 0.9 %  sodium chloride infusion  250 mL Intravenous PRN Lorretta Harp, MD      . 0.9% sodium chloride infusion  1 mL/kg/hr Intravenous Continuous Darlin Coco, MD 123.3 mL/hr at 01/04/15 1155 1 mL/kg/hr at 01/04/15 1155  . 0.9% sodium chloride infusion  3 mL/kg/hr Intravenous Continuous Lorretta Harp, MD      . acetaminophen (TYLENOL) tablet 650 mg  650 mg Oral Q4H PRN Jay Schlichter, MD   650 mg at 01/04/15 0854  . acetaminophen (TYLENOL) tablet 650 mg  650 mg Oral Q4H PRN Lorretta Harp, MD      .  albuterol (PROVENTIL) (2.5 MG/3ML) 0.083% nebulizer solution 3 mL  3 mL Inhalation Q4H PRN Jay Schlichter, MD   3 mL at 01/02/15 2210  . ALPRAZolam Duanne Moron) tablet 0.25 mg  0.25 mg Oral BID PRN Lorretta Harp, MD      . aspirin chewable tablet 81 mg  81 mg Oral Daily Lorretta Harp, MD      . aspirin EC tablet 81 mg  81 mg Oral Daily Jay Schlichter, MD   81 mg at 01/04/15 1112  . atorvastatin (LIPITOR) tablet 40 mg  40 mg Oral Daily Jay Schlichter, MD   40 mg at 01/04/15 1111  . atorvastatin (LIPITOR) tablet 80 mg  80 mg Oral q1800 Lorretta Harp, MD      . buPROPion [facility] SR) 12 hr tablet 150 mg  150 mg Oral Daily Jay Schlichter, MD   150 mg at 01/04/15 1111  . LORazepam  (ATIVAN) injection 1 mg  1 mg Intravenous Once [name], MD      . metoprolol tartrate (LOPRESSOR) tablet 12.5 mg  12.5 mg Oral BID Jay Schlichter, MD   12.5 mg at 01/04/15 1112  . morphine 2 MG/ML injection 2 mg  2 mg Intravenous Q1H PRN Lorretta Harp, MD      . morphine 4 MG/ML injection           . multivitamin with minerals tablet 1 tablet  1 tablet Oral Daily Jay Schlichter, MD   1 tablet at 01/04/15 1111  . nitroGLYCERIN (NITROGLYN) 2 % ointment 1 inch  1 inch Topical Once Veryl Speak, MD   Stopped at 01/02/15 0128  . nitroGLYCERIN (NITROSTAT) SL tablet 0.4 mg  0.4 mg Sublingual Q5 min PRN Jay Schlichter, MD      . ondansetron [facility]) injection 4 mg  4 mg Intravenous Q6H PRN Jay Schlichter, MD      . ondansetron [facility]) injection 4 mg  4 mg Intravenous Q6H PRN Lorretta Harp, MD      . prasugrel (EFFIENT) tablet 10 mg  10 mg Oral Daily Jay Schlichter, MD   10 mg at 01/04/15 1112  . prasugrel (EFFIENT) tablet 10 mg  10 mg Oral Daily Lorretta Harp, MD      . sodium chloride 0.9 % injection 3 mL  3 mL Intravenous Q12H Darlin Coco, MD   3 mL at 01/04/15 1113  . sodium chloride 0.9 % injection 3 mL  3 mL Intravenous PRN Darlin Coco, MD      . sodium chloride 0.9 % injection 3 mL  3 mL Intravenous Q12H Lorretta Harp, MD      . sodium chloride 0.9 % injection 3 mL  3 mL Intravenous PRN Lorretta Harp, MD          Physical Examination  Filed Vitals:   01/04/15 1618 01/04/15 1632 01/04/15 1646 01/04/15 1700  BP: 127/75 114/73 125/88 120/83  Pulse: 78 63 73 73  Temp:      TempSrc:      Resp:      Height:      Weight:      SpO2: 95% 94% 94% 97%    Body mass index is 35.87 kg/(m^2).  General:  Alert and oriented, no acute distress HEENT: Normal Cardiac: Regular Rate and Rhythm  Skin: No rash, no ecchymosis Extremity Pulses:  Absent radial pulse but cuff in place  Musculoskeletal: Volar compartments slightly full but not tense, dorsal compartments  soft  Neurologic:  Upper and lower extremity motor 5/5 and symmetric  ASSESSMENT:  Subfascial hematoma post radial artery cath   PLAN:  1. Admit pt for observation, arm elevation 2. Consider exploration of radial artery if increased pain or swelling in forearm.  I have also discussed the case with Dr Caralyn Guile from hand surgery who also saw pt and will follow as well.  Ruta Hinds, MD [facility] [place] Office: [phone] Pager: [phone]

## 2015-01-04 NOTE — Progress Notes (Addendum)
Patient Name: Angel Gutierrez Date of Encounter: 01/04/2015  Active Problems:   Unstable angina     Primary Cardiologist: Dr. Gwenlyn Found Patient Profile: 45 yo male w/ PMH of CAD (BMS to Circumflex-2012, DES to mid-RCA-2015 ), history of tobacco abuse, and HLD admitted on 01/01/2015 for unstable angina. LHC planned on 01/04/2015 at 1500.  SUBJECTIVE: Reports some chest pressure along his sternum that has been off/on since admission. Denies any palpitations, shortness of breath, nausea, or vomiting.  OBJECTIVE Filed Vitals:   01/03/15 0534 01/03/15 1347 01/03/15 2123 01/04/15 0329  BP: 117/80 125/80 125/78 128/77  Pulse: 70 57 58 60  Temp: 98.2 F (36.8 C) 98.4 F (36.9 C) 98.3 F (36.8 C) 97.7 F (36.5 C)  TempSrc: Oral Oral Oral Oral  Resp: 18 18 18 16   Height:      Weight:      SpO2: 97% 97% 98% 96%    Intake/Output Summary (Last 24 hours) at 01/04/15 1004 Last data filed at 01/04/15 0855  Gross per 24 hour  Intake    480 ml  Output    501 ml  Net    -21 ml   Filed Weights   01/01/15 2055 01/02/15 0211  Weight: 270 lb (122.471 kg) 271 lb 12.8 oz (123.288 kg)    PHYSICAL EXAM General: Well developed, well nourished, male in no acute distress. Head: Normocephalic, atraumatic.  Neck: Supple without bruits, JVD not elevated. Lungs:  Resp regular and unlabored, CTA without wheezing or rales. Heart: RRR, S1, S2, no S3, S4, or murmur; no rub. Abdomen: Soft, non-tender, non-distended with normoactive bowel sounds. No hepatomegaly. No rebound/guarding. No obvious abdominal masses. Extremities: No clubbing, cyanosis, or edema. Distal pedal pulses are 2+ bilaterally. Neuro: Alert and oriented X 3. Moves all extremities spontaneously. Psych: Normal affect.   LABS: CBC: Recent Labs  01/03/15 0416 01/04/15 0329  WBC 8.7 9.3  HGB 15.2 15.6  HCT 45.2 46.4  MCV 93.2 92.4  PLT 220 250   INR: Recent Labs  01/02/15 0530  INR 1.61   Basic Metabolic Panel: Recent  Labs  01/03/15 0416 01/04/15 0329  NA 138 136  K 4.0 4.1  CL 105 103  CO2 24 25  GLUCOSE 131* 97  BUN 9 10  CREATININE 1.15 1.28*  CALCIUM 9.3 9.2   Cardiac Enzymes: Recent Labs  01/02/15 0140 01/02/15 0810 01/02/15 1330  TROPONINI <0.03 <0.03 <0.03    Recent Labs  01/01/15 2118  TROPIPOC 0.01   Recent Labs  01/02/15 0530  TSH 2.327    Telemetry: Normal sinus rhythm with rate in 50's - 70's. No atopic events.   Current Medications:  . aspirin EC  81 mg Oral Daily  . atorvastatin  40 mg Oral Daily  . buPROPion  150 mg Oral Daily  . metoprolol tartrate  12.5 mg Oral BID  . multivitamin with minerals  1 tablet Oral Daily  . nitroGLYCERIN  1 inch Topical Once  . prasugrel  10 mg Oral Daily  . sodium chloride  3 mL Intravenous Q12H   . sodium chloride 1 mL/kg/hr (01/04/15 0700)  . heparin 1,400 Units/hr (01/03/15 1316)    ASSESSMENT AND PLAN:  1. Unstable angina - history of CAD with BMS to Circumflex-2012 and DES to [id number]. - cyclic troponin values have been negative and EKG without changes. Patient continues to have sternal chest pressure. - continue ASA, Statin, BB, Effient, Heparin, and NTG. - planned LHC today at 1500 with Dr.  Gwenlyn Found. The patient understands that risks included but are not limited to stroke (1 in 8), death (1 in 20), kidney failure [usually temporary] (1 in 500), bleeding (1 in 200), allergic reaction [possibly serious] (1 in 200).   2. HLD - continue statin therapy.  3. HTN - BP has been 125/77 - 128/80 in the past 24 hours. - Continue current medical therapy  Signed, [name] , PA-C 10:04 AM 01/04/2015 Pager: [phone]   The patient was seen, examined and discussed with [name], PA-C and I agree with the above.    44 year old male with known CAD, prior BMS to [place] and DES to mid-RCA-2015 who presented with a typical angina, treated as UA, on heparin drip, negative troponin and no  significant ECG changes. LHC scheduled for 3 pm today.  He is having on and off chest pain. BP is controlled. Crea is mildly elevated 1.15 --> 1.28, we will monitor post cath. Continue ASA, atorvastatin, metoprolol, Effient, Heparin, and NTG. The patient is in distress as he just found out that his 32 year old mother in law passed out in our hospital of pulmonary embolism, I will give him ativan 1mg  in x 1.  [name] 01/04/2015

## 2015-01-04 NOTE — H&P (View-Only) (Signed)
Patient Name: Angel Gutierrez Date of Encounter: 01/04/2015  Active Problems:   Unstable angina     Primary Cardiologist: Dr. Gwenlyn Found Patient Profile: 44 yo male w/ PMH of CAD (BMS to Circumflex-2012, DES to mid-RCA-2015 ), history of tobacco abuse, and HLD admitted on 01/01/2015 for unstable angina. LHC planned on 01/04/2015 at 1500.  SUBJECTIVE: Reports some chest pressure along his sternum that has been off/on since admission. Denies any palpitations, shortness of breath, nausea, or vomiting.  OBJECTIVE Filed Vitals:   01/03/15 0534 01/03/15 1347 01/03/15 2123 01/04/15 0329  BP: 117/80 125/80 125/78 128/77  Pulse: 70 57 58 60  Temp: 98.2 F (36.8 C) 98.4 F (36.9 C) 98.3 F (36.8 C) 97.7 F (36.5 C)  TempSrc: Oral Oral Oral Oral  Resp: 18 18 18 16   Height:      Weight:      SpO2: 97% 97% 98% 96%    Intake/Output Summary (Last 24 hours) at 01/04/15 1004 Last data filed at 01/04/15 0855  Gross per 24 hour  Intake    480 ml  Output    501 ml  Net    -21 ml   Filed Weights   01/01/15 2055 01/02/15 0211  Weight: 270 lb (122.471 kg) 271 lb 12.8 oz (123.288 kg)    PHYSICAL EXAM General: Well developed, well nourished, male in no acute distress. Head: Normocephalic, atraumatic.  Neck: Supple without bruits, JVD not elevated. Lungs:  Resp regular and unlabored, CTA without wheezing or rales. Heart: RRR, S1, S2, no S3, S4, or murmur; no rub. Abdomen: Soft, non-tender, non-distended with normoactive bowel sounds. No hepatomegaly. No rebound/guarding. No obvious abdominal masses. Extremities: No clubbing, cyanosis, or edema. Distal pedal pulses are 2+ bilaterally. Neuro: Alert and oriented X 3. Moves all extremities spontaneously. Psych: Normal affect.   LABS: CBC: Recent Labs  01/03/15 0416 01/04/15 0329  WBC 8.7 9.3  HGB 15.2 15.6  HCT 45.2 46.4  MCV 93.2 92.4  PLT 220 250   INR: Recent Labs  01/02/15 0530  INR 4.81   Basic Metabolic Panel: Recent  Labs  01/03/15 0416 01/04/15 0329  NA 138 136  K 4.0 4.1  CL 105 103  CO2 24 25  GLUCOSE 131* 97  BUN 9 10  CREATININE 1.15 1.28*  CALCIUM 9.3 9.2   Cardiac Enzymes: Recent Labs  01/02/15 0140 01/02/15 0810 01/02/15 1330  TROPONINI <0.03 <0.03 <0.03    Recent Labs  01/01/15 2118  TROPIPOC 0.01   Recent Labs  01/02/15 0530  TSH 2.327    Telemetry: Normal sinus rhythm with rate in 50's - 70's. No atopic events.   Current Medications:  . aspirin EC  81 mg Oral Daily  . atorvastatin  40 mg Oral Daily  . buPROPion  150 mg Oral Daily  . metoprolol tartrate  12.5 mg Oral BID  . multivitamin with minerals  1 tablet Oral Daily  . nitroGLYCERIN  1 inch Topical Once  . prasugrel  10 mg Oral Daily  . sodium chloride  3 mL Intravenous Q12H   . sodium chloride 1 mL/kg/hr (01/04/15 0700)  . heparin 1,400 Units/hr (01/03/15 1316)    ASSESSMENT AND PLAN:  1. Unstable angina - history of CAD with BMS to Circumflex-2012 and DES to [id number]. - cyclic troponin values have been negative and EKG without changes. Patient continues to have sternal chest pressure. - continue ASA, Statin, BB, Effient, Heparin, and NTG. - planned LHC today at 1500 with Dr.  Gwenlyn Found. The patient understands that risks included but are not limited to stroke (1 in 25), death (1 in 52), kidney failure [usually temporary] (1 in 500), bleeding (1 in 200), allergic reaction [possibly serious] (1 in 200).   2. HLD - continue statin therapy.  3. HTN - BP has been 125/77 - 128/80 in the past 24 hours. - Continue current medical therapy  Signed, [name] , PA-C 10:04 AM 01/04/2015 Pager: [phone]   The patient was seen, examined and discussed with [name], PA-C and I agree with the above.    44 year old male with known CAD, prior BMS to [place] and DES to mid-RCA-2015 who presented with a typical angina, treated as UA, on heparin drip, negative troponin and no  significant ECG changes. LHC scheduled for 3 pm today.  He is having on and off chest pain. BP is controlled. Crea is mildly elevated 1.15 --> 1.28, we will monitor post cath. Continue ASA, atorvastatin, metoprolol, Effient, Heparin, and NTG. The patient is in distress as he just found out that his 85 year old mother in law passed out in our hospital of pulmonary embolism, I will give him ativan 1mg  in x 1.  [name] 01/04/2015

## 2015-01-04 NOTE — Progress Notes (Signed)
Utilization review completed.  

## 2015-01-04 NOTE — Progress Notes (Signed)
RT note: patient pulled a -40 NIF and had Vt 1400 with 0 PS. Decreased FIO2 to 50%.

## 2015-01-05 ENCOUNTER — Encounter (HOSPITAL_COMMUNITY): Payer: BLUE CROSS/BLUE SHIELD

## 2015-01-05 ENCOUNTER — Encounter (HOSPITAL_COMMUNITY): Payer: Self-pay | Admitting: Cardiovascular Disease

## 2015-01-05 DIAGNOSIS — I10 Essential (primary) hypertension: Secondary | ICD-10-CM

## 2015-01-05 DIAGNOSIS — J4520 Mild intermittent asthma, uncomplicated: Secondary | ICD-10-CM

## 2015-01-05 DIAGNOSIS — T79A0XA Compartment syndrome, unspecified, initial encounter: Secondary | ICD-10-CM

## 2015-01-05 DIAGNOSIS — I209 Angina pectoris, unspecified: Secondary | ICD-10-CM

## 2015-01-05 DIAGNOSIS — T79A11A Traumatic compartment syndrome of right upper extremity, initial encounter: Secondary | ICD-10-CM | POA: Diagnosis not present

## 2015-01-05 DIAGNOSIS — Z955 Presence of coronary angioplasty implant and graft: Secondary | ICD-10-CM

## 2015-01-05 LAB — BASIC METABOLIC PANEL
Anion gap: 8 (ref 5–15)
BUN: 8 mg/dL (ref 6–20)
CHLORIDE: 101 mmol/L (ref 101–111)
CO2: 26 mmol/L (ref 22–32)
Calcium: 9.1 mg/dL (ref 8.9–10.3)
Creatinine, Ser: 1.21 mg/dL (ref 0.61–1.24)
GFR calc non Af Amer: >60 mL/min (ref >60)
Glucose, Bld: 123 mg/dL — ABNORMAL HIGH (ref 65–99)
POTASSIUM: 4.4 mmol/L (ref 3.5–5.1)
SODIUM: 135 mmol/L (ref 135–145)

## 2015-01-05 LAB — MRSA PCR SCREENING: MRSA by PCR: NEGATIVE

## 2015-01-05 LAB — GLUCOSE, CAPILLARY: GLUCOSE-CAPILLARY: 119 mg/dL — AB (ref 65–99)

## 2015-01-05 LAB — CBC
HEMATOCRIT: 45.2 % (ref 39.0–52.0)
HEMOGLOBIN: 15.2 g/dL (ref 13.0–17.0)
MCH: 31.3 pg (ref 26.0–34.0)
MCHC: 33.6 g/dL (ref 30.0–36.0)
MCV: 93 fL (ref 78.0–100.0)
Platelets: 196 10*3/uL (ref 150–400)
RBC: 4.86 MIL/uL (ref 4.22–5.81)
RDW: 13.1 % (ref 11.5–15.5)
WBC: 17.2 10*3/uL — ABNORMAL HIGH (ref 4.0–10.5)

## 2015-01-05 MED ORDER — TRAMADOL HCL 50 MG PO TABS
50.0000 mg | ORAL_TABLET | Freq: Four times a day (QID) | ORAL | Status: DC | PRN
  Administered 2015-01-05: 100 mg via ORAL
  Filled 2015-01-05: qty 2

## 2015-01-05 MED ORDER — OXYCODONE-ACETAMINOPHEN 5-325 MG PO TABS
1.0000 | ORAL_TABLET | ORAL | Status: DC | PRN
  Administered 2015-01-05: 2 via ORAL
  Administered 2015-01-05: 1 via ORAL
  Administered 2015-01-06 – 2015-01-07 (×5): 2 via ORAL
  Filled 2015-01-05 (×7): qty 2

## 2015-01-05 MED ORDER — MORPHINE SULFATE (PF) 2 MG/ML IV SOLN
2.0000 mg | INTRAVENOUS | Status: DC | PRN
  Administered 2015-01-05 (×3): 2 mg via INTRAVENOUS
  Administered 2015-01-05: 4 mg via INTRAVENOUS
  Administered 2015-01-05 (×2): 3 mg via INTRAVENOUS
  Administered 2015-01-05 – 2015-01-06 (×7): 4 mg via INTRAVENOUS
  Administered 2015-01-06: 2 mg via INTRAVENOUS
  Administered 2015-01-06 – 2015-01-07 (×3): 4 mg via INTRAVENOUS
  Filled 2015-01-05 (×4): qty 2
  Filled 2015-01-05: qty 1
  Filled 2015-01-05 (×7): qty 2
  Filled 2015-01-05 (×2): qty 1
  Filled 2015-01-05 (×5): qty 2

## 2015-01-05 MED ORDER — PANTOPRAZOLE SODIUM 40 MG PO TBEC
40.0000 mg | DELAYED_RELEASE_TABLET | Freq: Every day | ORAL | Status: DC
  Administered 2015-01-05 – 2015-01-07 (×3): 40 mg via ORAL
  Filled 2015-01-05 (×3): qty 1

## 2015-01-05 MED FILL — Nitroglycerin IV Soln 100 MCG/ML in D5W: INTRA_ARTERIAL | Qty: 10 | Status: AC

## 2015-01-05 NOTE — Consult Note (Signed)
[facility] [place]  Subjective  - Arm feels better hand feels better has incisional pain   Objective 106/64 85 98.5 F (36.9 C) (Oral) 17 91%  Intake/Output Summary (Last 24 hours) at 01/05/15 0751 Last data filed at 01/05/15 0700  Gross per 24 hour  Intake 2023.33 ml  Output   2300 ml  Net -276.67 ml   Right hand pink warm 2+ Radial pulse  Assessment/Planning: Change dressing today NS wet to dry TID right forearm NPO p midnight for closure tomorrow Ok to transfer out of ICU from my standpoint  Ruta Hinds 01/05/2015 7:51 AM --  Laboratory Lab Results:  Recent Labs  01/04/15 0329 01/05/15 0028  WBC 9.3 17.2*  HGB 15.6 15.2  HCT 46.4 45.2  PLT 250 196   BMET  Recent Labs  01/04/15 0329 01/05/15 0028  NA 136 135  K 4.1 4.4  CL 103 101  CO2 25 26  GLUCOSE 97 123*  BUN 10 8  CREATININE 1.28* 1.21  CALCIUM 9.2 9.1    COAG Lab Results  Component Value Date   INR 1.12 01/02/2015   INR 0.97 01/29/2014   INR 0.95 10/12/2010   No results found for: PTT

## 2015-01-05 NOTE — Progress Notes (Signed)
Patient Name: Angel Gutierrez Date of Encounter: 01/05/2015  Principal Problem:   Unstable angina Active Problems:   Compartment syndrome of right upper extremity   Essential hypertension   Hyperlipidemia   S/P coronary artery stent placement   Chest pain   Primary Cardiologist: Dr Gwenlyn Found  Patient Profile: 44 y.o. male with a history of non-STEMI 2012 with BMS to the CFX, DES to the RCA in 2015 and preserved left ventricular function. Admitted 09/09 w/ USAP, cath w/ non-obs dz, was to be d/c'd. Then, post-cath had compartment syndrome R forearm, Dr Oneida Alar took to OR 09/12.  SUBJECTIVE: No chest pain, throat pain and R arm pain, better w/ morphine  OBJECTIVE Filed Vitals:   01/05/15 0600 01/05/15 0700 01/05/15 0745 01/05/15 0800  BP: 117/68 106/64  88/58  Pulse: 80 85 73 80  Temp:   98.5 F (36.9 C)   TempSrc:   Oral   Resp: 16 17 13 19   Height:      Weight: 272 lb 4.3 oz (123.5 kg)     SpO2: 94% 91% 97% 94%    Intake/Output Summary (Last 24 hours) at 01/05/15 0914 Last data filed at 01/05/15 0800  Gross per 24 hour  Intake 2073.33 ml  Output   2300 ml  Net -226.67 ml   Filed Weights   01/01/15 2055 01/02/15 0211 01/05/15 0600  Weight: 270 lb (122.471 kg) 271 lb 12.8 oz (123.288 kg) 272 lb 4.3 oz (123.5 kg)    PHYSICAL EXAM General: Well developed, well nourished, male in no acute distress. Head: Normocephalic, atraumatic.  Neck: Supple without bruits, JVD. Lungs:  Resp regular and unlabored, CTA. Heart: RRR, S1, S2, no S3, S4, or murmur; no rub. Abdomen: Soft, non-tender, non-distended, BS + x 4.  Extremities: No clubbing, cyanosis, edema.  Neuro: Alert and oriented X 3. Moves all extremities spontaneously. Psych: Normal affect.  LABS: CBC:  Recent Labs  01/04/15 0329 01/05/15 0028  WBC 9.3 17.2*  HGB 15.6 15.2  HCT 46.4 45.2  MCV 92.4 93.0  PLT 250 621   Basic Metabolic Panel:  Recent Labs  01/04/15 0329 01/05/15 0028  NA 136 135  K 4.1  4.4  CL 103 101  CO2 25 26  GLUCOSE 97 123*  BUN 10 8  CREATININE 1.28* 1.21  CALCIUM 9.2 9.1   Cardiac Enzymes:  Recent Labs  01/02/15 1330  TROPONINI <0.03   TELE:  SR, ST  Radiology/Studies: Dg Chest 1 View  01/04/2015   CLINICAL DATA:  Intubation.  EXAM: CHEST  1 VIEW  COMPARISON:  01/01/2015  FINDINGS: Endotracheal tube has been placed with tip approximately 1.6 cm above the carina. Lungs are hypoinflated with minimal left base/ retrocardiac opacification. No definite effusion or pneumothorax. Cardiomediastinal silhouette and remainder of the exam is unchanged.  IMPRESSION: Hypoinflation with minimal left base/ retrocardiac opacification which may be due to atelectasis or infection.  Endotracheal tube with tip 1.6 cm above the carina.   Electronically Signed   By: Marin Olp M.D.   On: 01/04/2015 21:52     Current Medications:  . aspirin EC  81 mg Oral Daily  . atorvastatin  40 mg Oral Daily  . buPROPion  150 mg Oral Daily  . cefUROXime (ZINACEF)  IV  1.5 g Intravenous Q12H  . fentaNYL      . LORazepam  1 mg Intravenous Once  . metoprolol tartrate  12.5 mg Oral BID  . morphine  4 mg Intravenous Once  .  multivitamin with minerals  1 tablet Oral Daily  . prasugrel  10 mg Oral Daily  . sodium chloride  3 mL Intravenous Q12H   . dextrose 5 % and 0.45% NaCl 50 mL/hr (01/04/15 2202)    ASSESSMENT AND PLAN: Principal Problem:   Unstable angina - no obstructive disease at cath - med rx w/ emphasis on CRF reduction - on ASA, Lipitor, BB, Effient  Active Problems:   Compartment syndrome of RUE - mgt per Dr Oneida Alar - possible wound closure 09/14 - increase pain management  Otherwise, continue current therapy, duplicate meds on med list removed.   Essential hypertension   Hyperlipidemia   S/P coronary artery stent placement   Chest pain   Signed, Lenoard Aden 9:14 AM 01/05/2015  The patient was seen, examined and discussed with Rosaria Ferries, PA-C  and I agree with the above.   44 year old male with known CAD, prior BMS to [place] and DES to mid-RCA-2015 who presented with a typical angina, treated as UA, on heparin drip, negative troponin and no significant ECG changes. LHC yesterday showed non-obstructive CAD, but the patient developed a compartment syndrome of the right forearm and was taken to the OR yesterday. Pain controlled with morphine. Possible wound closure 09/14. Crea stable post cath at 1.2. Continue ASA, atorvastatin, metoprolol, Effient.  [name] 01/05/2015

## 2015-01-05 NOTE — Anesthesia Postprocedure Evaluation (Signed)
  Anesthesia Post-op Note  Patient: Angel Gutierrez  Procedure(s) Performed: Procedure(s): Left Forearm Fasciotomy (Right)  Patient Location: PACU  Anesthesia Type:General  Level of Consciousness: awake  Airway and Oxygen Therapy: Patient Spontanous Breathing  Post-op Pain: mild  Post-op Assessment: Post-op Vital signs reviewed LLE Motor Response: Purposeful movement, Responds to commands   RLE Motor Response: Purposeful movement, Responds to commands        Post-op Vital Signs: Reviewed  Last Vitals:  Filed Vitals:   01/04/15 2345  BP: 103/65  Pulse: 70  Temp:   Resp: 17    Complications: No apparent anesthesia complications Extubated in PACU and did well

## 2015-01-06 ENCOUNTER — Inpatient Hospital Stay (HOSPITAL_COMMUNITY): Payer: BLUE CROSS/BLUE SHIELD | Admitting: Anesthesiology

## 2015-01-06 ENCOUNTER — Encounter (HOSPITAL_COMMUNITY): Payer: Self-pay | Admitting: Anesthesiology

## 2015-01-06 ENCOUNTER — Encounter (HOSPITAL_COMMUNITY): Admission: EM | Disposition: A | Discharge status: Home / Self Care | Payer: Self-pay | Source: Home / Self Care

## 2015-01-06 ENCOUNTER — Encounter: Payer: Self-pay | Admitting: Cardiovascular Disease

## 2015-01-06 HISTORY — PX: FASCIOTOMY CLOSURE: SHX5829

## 2015-01-06 SURGERY — FASCIOTOMY CLOSURE
Anesthesia: General | Site: Arm Lower | Laterality: Right

## 2015-01-06 MED ORDER — ONDANSETRON HCL 4 MG/2ML IJ SOLN: 4.0000 mg | Freq: Once | INTRAMUSCULAR | Status: DC | PRN

## 2015-01-06 MED ORDER — MIDAZOLAM HCL 5 MG/5ML IJ SOLN
INTRAMUSCULAR | Status: DC | PRN
  Administered 2015-01-06: 2 mg via INTRAVENOUS

## 2015-01-06 MED ORDER — 0.9 % SODIUM CHLORIDE (POUR BTL) OPTIME
TOPICAL | Status: DC | PRN
  Administered 2015-01-06: 1000 mL

## 2015-01-06 MED ORDER — LIDOCAINE HCL (CARDIAC) 20 MG/ML IV SOLN
INTRAVENOUS | Status: AC
  Filled 2015-01-06: qty 5

## 2015-01-06 MED ORDER — BUPIVACAINE HCL (PF) 0.25 % IJ SOLN
INTRAMUSCULAR | Status: AC
  Filled 2015-01-06: qty 30

## 2015-01-06 MED ORDER — BUPIVACAINE HCL (PF) 0.25 % IJ SOLN
INTRAMUSCULAR | Status: DC | PRN
  Administered 2015-01-06: 5 mL

## 2015-01-06 MED ORDER — FENTANYL CITRATE (PF) 100 MCG/2ML IJ SOLN: 25.0000 ug | INTRAMUSCULAR | Status: DC | PRN

## 2015-01-06 MED ORDER — ONDANSETRON HCL 4 MG/2ML IJ SOLN
INTRAMUSCULAR | Status: AC
  Filled 2015-01-06: qty 2

## 2015-01-06 MED ORDER — PROPOFOL 10 MG/ML IV BOLUS
INTRAVENOUS | Status: AC
  Filled 2015-01-06: qty 20

## 2015-01-06 MED ORDER — LACTATED RINGERS IV SOLN
INTRAVENOUS | Status: DC | PRN
  Administered 2015-01-06: 12:00:00 via INTRAVENOUS

## 2015-01-06 MED ORDER — DEXTROSE 5 % IV SOLN
INTRAVENOUS | Status: AC
  Administered 2015-01-06: 1.5 g via INTRAVENOUS
  Filled 2015-01-06: qty 1.5

## 2015-01-06 MED ORDER — LIDOCAINE HCL (PF) 1 % IJ SOLN
INTRAMUSCULAR | Status: DC | PRN
  Administered 2015-01-06: 5 mL via INTRADERMAL

## 2015-01-06 MED ORDER — CEFUROXIME SODIUM 1.5 G IJ SOLR
1.5000 g | Freq: Two times a day (BID) | INTRAMUSCULAR | Status: DC
  Administered 2015-01-07: 1.5 g via INTRAVENOUS
  Filled 2015-01-06 (×2): qty 1.5

## 2015-01-06 MED ORDER — LIDOCAINE HCL (CARDIAC) 20 MG/ML IV SOLN
INTRAVENOUS | Status: DC | PRN
  Administered 2015-01-06: 80 mg via INTRAVENOUS

## 2015-01-06 MED ORDER — PROPOFOL 10 MG/ML IV BOLUS
INTRAVENOUS | Status: DC | PRN
  Administered 2015-01-06: 170 mg via INTRAVENOUS

## 2015-01-06 MED ORDER — SUCCINYLCHOLINE CHLORIDE 20 MG/ML IJ SOLN
INTRAMUSCULAR | Status: DC | PRN
  Administered 2015-01-06: 120 mg via INTRAVENOUS

## 2015-01-06 MED ORDER — LIDOCAINE HCL (PF) 1 % IJ SOLN
INTRAMUSCULAR | Status: AC
  Filled 2015-01-06: qty 30

## 2015-01-06 MED ORDER — SUCCINYLCHOLINE CHLORIDE 20 MG/ML IJ SOLN
INTRAMUSCULAR | Status: AC
  Filled 2015-01-06: qty 1

## 2015-01-06 MED ORDER — LACTATED RINGERS IV SOLN: INTRAVENOUS | Status: DC

## 2015-01-06 MED ORDER — FENTANYL CITRATE (PF) 250 MCG/5ML IJ SOLN
INTRAMUSCULAR | Status: AC
  Filled 2015-01-06: qty 5

## 2015-01-06 MED ORDER — ONDANSETRON HCL 4 MG/2ML IJ SOLN
INTRAMUSCULAR | Status: DC | PRN
  Administered 2015-01-06: 4 mg via INTRAVENOUS

## 2015-01-06 MED ORDER — FENTANYL CITRATE (PF) 100 MCG/2ML IJ SOLN
INTRAMUSCULAR | Status: DC | PRN
  Administered 2015-01-06: 100 ug via INTRAVENOUS

## 2015-01-06 SURGICAL SUPPLY — 35 items
BANDAGE ELASTIC 4 VELCRO ST LF (GAUZE/BANDAGES/DRESSINGS) IMPLANT
BANDAGE ELASTIC 6 VELCRO ST LF (GAUZE/BANDAGES/DRESSINGS) IMPLANT
BNDG GAUZE ELAST 4 BULKY (GAUZE/BANDAGES/DRESSINGS) IMPLANT
CANISTER SUCTION 2500CC (MISCELLANEOUS) ×2 IMPLANT
DRAPE ORTHO SPLIT 77X108 STRL (DRAPES) ×1
DRAPE PROXIMA HALF (DRAPES) ×2 IMPLANT
DRAPE SURG ORHT 6 SPLT 77X108 (DRAPES) ×1 IMPLANT
DRAPE U-SHAPE 47X51 STRL (DRAPES) ×2 IMPLANT
ELECT REM PT RETURN 9FT ADLT (ELECTROSURGICAL) ×2
ELECTRODE REM PT RTRN 9FT ADLT (ELECTROSURGICAL) ×1 IMPLANT
GAUZE SPONGE 4X4 12PLY STRL (GAUZE/BANDAGES/DRESSINGS) IMPLANT
GLOVE BIO SURGEON STRL SZ7.5 (GLOVE) ×4 IMPLANT
GLOVE BIOGEL PI IND STRL 8 (GLOVE) ×1 IMPLANT
GLOVE BIOGEL PI INDICATOR 8 (GLOVE) ×1
GLOVE SURG SS PI 7.0 STRL IVOR (GLOVE) ×2 IMPLANT
GOWN STRL REUS W/ TWL LRG LVL3 (GOWN DISPOSABLE) ×1 IMPLANT
GOWN STRL REUS W/ TWL XL LVL3 (GOWN DISPOSABLE) ×2 IMPLANT
GOWN STRL REUS W/TWL LRG LVL3 (GOWN DISPOSABLE) ×1
GOWN STRL REUS W/TWL XL LVL3 (GOWN DISPOSABLE) ×2
KIT BASIN OR (CUSTOM PROCEDURE TRAY) ×2 IMPLANT
KIT ROOM TURNOVER OR (KITS) ×2 IMPLANT
LIQUID BAND (GAUZE/BANDAGES/DRESSINGS) ×2 IMPLANT
NEEDLE HYPO 25GX1X1/2 BEV (NEEDLE) ×2 IMPLANT
NS IRRIG 1000ML POUR BTL (IV SOLUTION) ×2 IMPLANT
PACK GENERAL/GYN (CUSTOM PROCEDURE TRAY) ×2 IMPLANT
PACK UNIVERSAL I (CUSTOM PROCEDURE TRAY) IMPLANT
PAD ARMBOARD 7.5X6 YLW CONV (MISCELLANEOUS) ×4 IMPLANT
STAPLER VISISTAT 35W (STAPLE) IMPLANT
SUT ETHILON 3 0 PS 1 (SUTURE) IMPLANT
SUT VIC AB 2-0 CTX 36 (SUTURE) IMPLANT
SUT VIC AB 3-0 SH 27 (SUTURE) ×1
SUT VIC AB 3-0 SH 27X BRD (SUTURE) ×1 IMPLANT
SUT VICRYL 4-0 PS2 18IN ABS (SUTURE) ×2 IMPLANT
SYR CONTROL 10ML LL (SYRINGE) ×2 IMPLANT
WATER STERILE IRR 1000ML POUR (IV SOLUTION) ×2 IMPLANT

## 2015-01-06 NOTE — Op Note (Signed)
Procedure: Closure of right forearm fasciotomy  Preoperative diagnosis: Status post compartment syndrome right forearm the  Postoperative diagnosis: Same  Anesthesia: Gen.  Indications: Patient is a 44 year old male who had a radial artery catheterization by Dr. Donnella Bi approximate 48 hours ago. The patient had a compartment syndrome in his right forearm post procedure. He had a fasciotomy performed that day. He now presents for closure of this fasciotomy.  Operative details: After obtaining informed consent, the patient was taken the operating room. The patient was placed in supine position on operating room table. After induction of general anesthesia and endotracheal intubation, the patient's entire right upper extremity was prepped and draped in usual sterile fashion. The right forearm was inspected. All the forearm muscles were pink and viable. The wound was thoroughly irrigated with 1 L of normal saline solution. Subcutaneous tissues were then reapproximated using a running 3-0 Vicryl suture. The skin was infiltrated with 20 cc of a mixture of quarter percent Marcaine and 1% lidocaine. The skin was then closed with a running 4 Vicryl subcuticular stitch. Dermabond was applied to the skin. The patient tolerated the procedure well and there were no complications. The answer and sponge and needle count was correct at the end of the case. The patient was taken the recovery room in stable condition.  Ruta Hinds, MD [facility] Office: [phone] Pager: [phone]

## 2015-01-06 NOTE — Anesthesia Preprocedure Evaluation (Signed)
Anesthesia Evaluation  Patient identified by MRN, date of birth, ID band Patient awake    Reviewed: Allergy & Precautions, NPO status , Patient's Chart, lab work & pertinent test results  Airway Mallampati: II  TM Distance: >3 FB Neck ROM: Full    Dental   Pulmonary shortness of breath, asthma , former smoker,    breath sounds clear to auscultation       Cardiovascular hypertension, + angina + CAD and + Past MI   Rhythm:Regular Rate:Normal     Neuro/Psych    GI/Hepatic negative GI ROS, Neg liver ROS,   Endo/Other    Renal/GU negative Renal ROS     Musculoskeletal   Abdominal   Peds  Hematology   Anesthesia Other Findings Cath 01/04/2015 that resulted in right forearm radial spasm requiring fasciotomy for compartment syndrome. Now presents for fasciotomy closure. Has non-obstructive coronary artery disease.   Reproductive/Obstetrics                             Anesthesia Physical  Anesthesia Plan  ASA: III and emergent  Anesthesia Plan: General   Post-op Pain Management:    Induction: Intravenous  Airway Management Planned: Oral ETT  Additional Equipment:   Intra-op Plan:   Post-operative Plan:   Informed Consent: I have reviewed the patients History and Physical, chart, labs and discussed the procedure including the risks, benefits and alternatives for the proposed anesthesia with the patient or authorized representative who has indicated his/her understanding and acceptance.   Dental advisory given  Plan Discussed with: CRNA and Anesthesiologist  Anesthesia Plan Comments: (Patient did have laryngospasm with last anesthetic, 9/12 )        Anesthesia Quick Evaluation

## 2015-01-06 NOTE — H&P (View-Only) (Signed)
[facility]   Subjective  - Arm feels better hand feels better has incisional pain   Objective 106/64 85 98.5 F (36.9 C) (Oral) 17 91%  Intake/Output Summary (Last 24 hours) at 01/05/15 0751 Last data filed at 01/05/15 0700  Gross per 24 hour  Intake 2023.33 ml  Output   2300 ml  Net -276.67 ml   Right hand pink warm 2+ Radial pulse  Assessment/Planning: Change dressing today NS wet to dry TID right forearm NPO p midnight for closure tomorrow Ok to transfer out of ICU from my standpoint  Ruta Hinds 01/05/2015 7:51 AM --  Laboratory Lab Results:  Recent Labs  01/04/15 0329 01/05/15 0028  WBC 9.3 17.2*  HGB 15.6 15.2  HCT 46.4 45.2  PLT 250 196   BMET  Recent Labs  01/04/15 0329 01/05/15 0028  NA 136 135  K 4.1 4.4  CL 103 101  CO2 25 26  GLUCOSE 97 123*  BUN 10 8  CREATININE 1.28* 1.21  CALCIUM 9.2 9.1    COAG Lab Results  Component Value Date   INR 1.12 01/02/2015   INR 0.97 01/29/2014   INR 0.95 10/12/2010   No results found for: PTT

## 2015-01-06 NOTE — Interval H&P Note (Signed)
History and Physical Interval Note:  01/06/2015 11:34 AM  Collene Schlichter  has presented today for surgery, with the diagnosis of Right forearm pain M79.601; Right forearm swelling M79.89  The various methods of treatment have been discussed with the patient and family. After consideration of risks, benefits and other options for treatment, the patient has consented to  Procedure(s): FASCIOTOMY CLOSURE (Right) as a surgical intervention .  The patient's history has been reviewed, patient examined, no change in status, stable for surgery.  I have reviewed the patient's chart and labs.  Questions were answered to the patient's satisfaction.     Ruta Hinds

## 2015-01-06 NOTE — Transfer of Care (Signed)
Immediate Anesthesia Transfer of Care Note  Patient: Angel Gutierrez  Procedure(s) Performed: Procedure(s): RIGHT ARM FASCIOTOMY CLOSURE (Right)  Patient Location: PACU  Anesthesia Type:General  Level of Consciousness: awake, alert , oriented and patient cooperative  Airway & Oxygen Therapy: Patient Spontanous Breathing and Patient connected to nasal cannula oxygen  Post-op Assessment: Report given to RN, Post -op Vital signs reviewed and stable and Patient moving all extremities  Post vital signs: Reviewed and stable  Last Vitals:  Filed Vitals:   01/06/15 1327  BP: 124/78  Pulse:   Temp: 36.7 C  Resp: 15    Complications: No apparent anesthesia complications

## 2015-01-06 NOTE — Anesthesia Postprocedure Evaluation (Signed)
  Anesthesia Post-op Note  Patient: Angel Gutierrez  Procedure(s) Performed: Procedure(s): RIGHT ARM FASCIOTOMY CLOSURE (Right)  Patient Location: PACU  Anesthesia Type: General   Level of Consciousness: awake, alert  and oriented  Airway and Oxygen Therapy: Patient Spontanous Breathing  Post-op Pain: moderate  Post-op Assessment: Post-op Vital signs reviewed  Post-op Vital Signs: Reviewed  Last Vitals:  Filed Vitals:   01/06/15 2017  BP: 122/72  Pulse: 67  Temp: 36.9 C  Resp: 16    Complications: No apparent anesthesia complications

## 2015-01-06 NOTE — Anesthesia Procedure Notes (Signed)
Procedure Name: Intubation Date/Time: 01/06/2015 12:40 PM Performed by: Greggory Stallion, Candela Krul L Pre-anesthesia Checklist: Patient identified, Emergency Drugs available, Suction available, Patient being monitored and Timeout performed Patient Re-evaluated:Patient Re-evaluated prior to inductionOxygen Delivery Method: Circle system utilized Preoxygenation: Pre-oxygenation with 100% oxygen Intubation Type: IV induction, Cricoid Pressure applied and Rapid sequence Ventilation: Mask ventilation with difficulty and Oral airway inserted - appropriate to patient size Laryngoscope Size: Mac and 4 Grade View: Grade II Tube type: Oral Tube size: 8.0 mm Number of attempts: 1 Airway Equipment and Method: Stylet Placement Confirmation: ETT inserted through vocal cords under direct vision,  positive ETCO2 and breath sounds checked- equal and bilateral Secured at: 22 cm Tube secured with: Tape Dental Injury: Teeth and Oropharynx as per pre-operative assessment

## 2015-01-06 NOTE — Progress Notes (Signed)
Patient Name: Angel Gutierrez Date of Encounter: 01/06/2015  Primary Cardiologist: Dr Gwenlyn Found  Patient Profile: 44 y.o. male with a history of non-STEMI 2012 with BMS to the CFX, DES to the RCA in 2015 and preserved left ventricular function. Admitted 09/09 w/ USAP, cath w/ non-obs dz, was to be d/c'd. Then, post-cath had compartment syndrome R forearm, Dr Oneida Alar took to OR 09/12.    Principal Problem:   Unstable angina Active Problems:   Essential hypertension   Hyperlipidemia   S/P coronary artery stent placement   Chest pain   Compartment syndrome of right upper extremity    SUBJECTIVE  Denies any CP or SOB last night, feeling good. No further numbness or tingling of hand. Fingers appears to have full range of motion.   CURRENT MEDS . aspirin EC  81 mg Oral Daily  . atorvastatin  40 mg Oral Daily  . buPROPion  150 mg Oral Daily  . LORazepam  1 mg Intravenous Once  . metoprolol tartrate  12.5 mg Oral BID  . multivitamin with minerals  1 tablet Oral Daily  . pantoprazole  40 mg Oral Daily  . prasugrel  10 mg Oral Daily  . sodium chloride  3 mL Intravenous Q12H    OBJECTIVE  Filed Vitals:   01/05/15 1153 01/05/15 1207 01/05/15 2041 01/06/15 0524  BP:  125/72 121/69 127/75  Pulse:  90 81 64  Temp: 97.5 F (36.4 C) 98.1 F (36.7 C) 98.1 F (36.7 C) 98.1 F (36.7 C)  TempSrc: Oral Oral Oral Oral  Resp:  19 18 16   Height:      Weight:    273 lb 1.6 oz (123.877 kg)  SpO2:  93% 94% 95%    Intake/Output Summary (Last 24 hours) at 01/06/15 0753 Last data filed at 01/05/15 1600  Gross per 24 hour  Intake   1400 ml  Output    550 ml  Net    850 ml   Filed Weights   01/02/15 0211 01/05/15 0600 01/06/15 0524  Weight: 271 lb 12.8 oz (123.288 kg) 272 lb 4.3 oz (123.5 kg) 273 lb 1.6 oz (123.877 kg)    PHYSICAL EXAM  General: Pleasant, NAD. Neuro: Alert and oriented X 3. Moves all extremities spontaneously. Psych: Normal affect. HEENT:  Normal  Neck: Supple without  bruits or JVD. Lungs:  Resp regular and unlabored, CTA. Heart: RRR no s3, s4, or murmurs. Abdomen: Soft, non-tender, non-distended, BS + x 4.  Extremities: No clubbing, cyanosis or edema. DP/PT/Radials 2+ and equal bilaterally. R forearm fasciotomy site wrapped in elastic dressing, 2+ distal radial pulse, good finger movement.  Accessory Clinical Findings  CBC  Recent Labs  01/04/15 0329 01/05/15 0028  WBC 9.3 17.2*  HGB 15.6 15.2  HCT 46.4 45.2  MCV 92.4 93.0  PLT 250 528   Basic Metabolic Panel  Recent Labs  01/04/15 0329 01/05/15 0028  NA 136 135  K 4.1 4.4  CL 103 101  CO2 25 26  GLUCOSE 97 123*  BUN 10 8  CREATININE 1.28* 1.21  CALCIUM 9.2 9.1    TELE NSR without significant ventricular ectopy    ECG  No new EKG   Radiology/Studies  Dg Chest 1 View  01/04/2015   CLINICAL DATA:  Intubation.  EXAM: CHEST  1 VIEW  COMPARISON:  01/01/2015  FINDINGS: Endotracheal tube has been placed with tip approximately 1.6 cm above the carina. Lungs are hypoinflated with minimal left base/ retrocardiac opacification. No definite effusion  or pneumothorax. Cardiomediastinal silhouette and remainder of the exam is unchanged.  IMPRESSION: Hypoinflation with minimal left base/ retrocardiac opacification which may be due to atelectasis or infection.  Endotracheal tube with tip 1.6 cm above the carina.   Electronically Signed   By: Marin Olp M.D.   On: 01/04/2015 21:52   Dg Chest 2 View  01/01/2015   CLINICAL DATA:  Hypertension.  Chest pain.  EXAM: CHEST  2 VIEW  COMPARISON:  01/29/2014  FINDINGS: The heart size and mediastinal contours are within normal limits. Both lungs are clear. The visualized skeletal structures are unremarkable.  IMPRESSION: No active cardiopulmonary disease.   Electronically Signed   By: Kerby Moors M.D.   On: 01/01/2015 21:43    ASSESSMENT AND PLAN  1. Chest pain- no obstructive dx on cath 9/12 (patent stent in LCx and RCA, 30% prox to mid LAD,  normal EF)  - on ASA, lipitor, metoprolol and effient.  2. Compartment syndrome of RUE post cath  - occurred after spasm of radial artery during sheath pull.   - urgent vascular consult, seen by Dr. Oneida Alar 9/12, taken to OR on the same day for R forearm fasciotomy  - management per vascular surgery. Planning for fasciotomy closure today, NPO for surgery, otherwise quite stable from cardiac perspective, great radial pulse distal to the R fasciotomy site, no tingling or numbness in the fingers.   3. Acute respiratory distress: pulmonary critical care consulted after pt developed distress after he was extubated following fasciotomy, felt to be due to laryngospasm and negative pressure edema. Later extubated again and stable since  4. CAD S/p BMS 2012 to LCx and DES to RCA 01/2014. 5. HTN 6. HLD  Signed, Woodward Ku Pager: [phone]   The patient was seen, examined and discussed with Almyra Deforest, PA-C and I agree with the above.   44 year old male with known CAD, prior BMS to [place] and DES to mid-RCA-2015 who presented with a typical angina, treated as UA, on heparin drip, negative troponin and no significant ECG changes. LHC on 9/12 showed non-obstructive CAD, but the patient developed a compartment syndrome of the right forearm and was taken to the OR post cath. Pain controlled with morphine, improved overnight. No chest pain.  He is being taken to the OR for fasciotomy closure. He has full range of motion in his fingers.  Crea stable post cath at 1.2. Continue ASA, atorvastatin, metoprolol, Effient.  [name] 01/06/2015

## 2015-01-06 NOTE — Interval H&P Note (Signed)
History and Physical Interval Note:  01/06/2015 11:35 AM  Angel Gutierrez  has presented today for surgery, with the diagnosis of Right forearm pain M79.601; Right forearm swelling M79.89  The various methods of treatment have been discussed with the patient and family. After consideration of risks, benefits and other options for treatment, the patient has consented to  Procedure(s): FASCIOTOMY CLOSURE (Right) as a surgical intervention .  The patient's history has been reviewed, patient examined, no change in status, stable for surgery.  I have reviewed the patient's chart and labs.  Questions were answered to the patient's satisfaction.     Ruta Hinds

## 2015-01-07 ENCOUNTER — Encounter (HOSPITAL_COMMUNITY): Payer: Self-pay | Admitting: Vascular Surgery

## 2015-01-07 ENCOUNTER — Telehealth: Payer: Self-pay | Admitting: Vascular Surgery

## 2015-01-07 MED ORDER — OXYCODONE-ACETAMINOPHEN 5-325 MG PO TABS: 1.0000 | ORAL_TABLET | ORAL | Status: DC | PRN

## 2015-01-07 MED ORDER — ALPRAZOLAM 0.25 MG PO TABS: 0.2500 mg | ORAL_TABLET | Freq: Every evening | ORAL | Status: DC | PRN

## 2015-01-07 MED ORDER — OXYCODONE HCL 5 MG PO TABS
5.0000 mg | ORAL_TABLET | Freq: Four times a day (QID) | ORAL | Status: DC | PRN
  Administered 2015-01-07: 5 mg via ORAL
  Filled 2015-01-07: qty 1

## 2015-01-07 NOTE — Progress Notes (Signed)
[facility]  Subjective  - arm feels great   Objective 113/62 76 99 F (37.2 C) (Oral) 16 94%  Intake/Output Summary (Last 24 hours) at 01/07/15 0840 Last data filed at 01/06/15 1600  Gross per 24 hour  Intake    690 ml  Output      0 ml  Net    690 ml   Some swelling of forearm but compartments soft Incision intact no real drainage  Assessment/Planning: Ok to d/c from my standpoint He needs follow up with me in 2 weeks Not sure of follow up arrangements with Dr Abigail Miyamoto, Juanda Crumble 01/07/2015 8:40 AM --  Laboratory Lab Results:  Recent Labs  01/05/15 0028  WBC 17.2*  HGB 15.2  HCT 45.2  PLT 196   BMET  Recent Labs  01/05/15 0028  NA 135  K 4.4  CL 101  CO2 26  GLUCOSE 123*  BUN 8  CREATININE 1.21  CALCIUM 9.1    COAG Lab Results  Component Value Date   INR 1.12 01/02/2015   INR 0.97 01/29/2014   INR 0.95 10/12/2010   No results found for: PTT

## 2015-01-07 NOTE — Progress Notes (Signed)
Patient Profile: 44 y.o. male with a history of non-STEMI 2012 with BMS to the CFX, DES to the RCA in 2015 and preserved left ventricular function. Admitted 09/09 w/ USAP, cath w/ non-obs dz, was to be d/c'd. Then, post-cath had compartment syndrome R forearm, Dr Oneida Alar took to OR 09/12.  Subjective: No recurrent CP. Right forearm is stable but still with some pain.   Objective: Vital signs in last 24 hours: Temp:  [97.8 F (36.6 C)-99 F (37.2 C)] 99 F (37.2 C) (09/15 0456) Pulse Rate:  [59-76] 76 (09/15 0456) Resp:  [11-20] 16 (09/15 0456) BP: (113-134)/(62-84) 113/62 mmHg (09/15 0456) SpO2:  [91 %-96 %] 94 % (09/15 0456) Weight:  [274 lb (124.286 kg)] 274 lb (124.286 kg) (09/15 0456) Last BM Date: 01/03/15  Intake/Output from previous day: 09/14 0701 - 09/15 0700 In: 690 [P.O.:240; I.V.:450] Out: -  Intake/Output this shift:    Medications Current Facility-Administered Medications  Medication Dose Route Frequency Provider Last Rate Last Dose  . 0.9 %  sodium chloride infusion  250 mL Intravenous PRN Lorretta Harp, MD      . acetaminophen (TYLENOL) tablet 650 mg  650 mg Oral Q4H PRN Lorretta Harp, MD      . albuterol (PROVENTIL) (2.5 MG/3ML) 0.083% nebulizer solution 3 mL  3 mL Inhalation Q4H PRN Jay Schlichter, MD   3 mL at 01/02/15 2210  . ALPRAZolam Duanne Moron) tablet 0.25 mg  0.25 mg Oral BID PRN Lorretta Harp, MD   0.25 mg at 01/06/15 2235  . aspirin EC tablet 81 mg  81 mg Oral Daily Jay Schlichter, MD   81 mg at 01/07/15 0831  . atorvastatin (LIPITOR) tablet 40 mg  40 mg Oral Daily Jay Schlichter, MD   40 mg at 01/07/15 0831  . buPROPion [facility] SR) 12 hr tablet 150 mg  150 mg Oral Daily Jay Schlichter, MD   150 mg at 01/06/15 1639  . cefUROXime (ZINACEF) 1.5 g in dextrose 5 % 50 mL IVPB  1.5 g Intravenous Q12H [name], MD   1.5 g at 01/07/15 0052  . dextrose 5 %-0.45 % sodium chloride infusion   Intravenous Continuous [name], MD    Stopped at 01/05/15 1200  . guaiFENesin-dextromethorphan (ROBITUSSIN DM) 100-10 MG/5ML syrup 15 mL  15 mL Oral Q4H PRN [name], MD      . hydrALAZINE (APRESOLINE) injection 5 mg  5 mg Intravenous Q20 Min PRN [name], MD      . lactated ringers infusion   Intravenous Continuous Jillyn Hidden, MD   Stopped at 01/06/15 1100  . LORazepam (ATIVAN) injection 1 mg  1 mg Intravenous Once [name], MD      . metoprolol tartrate (LOPRESSOR) tablet 12.5 mg  12.5 mg Oral BID Jay Schlichter, MD   12.5 mg at 01/07/15 0831  . morphine 2 MG/ML injection 2-4 mg  2-4 mg Intravenous Q1H PRN Evelene Croon Barrett, PA-C   4 mg at 01/07/15 0438  . multivitamin with minerals tablet 1 tablet  1 tablet Oral Daily Jay Schlichter, MD   1 tablet at 01/07/15 0831  . nitroGLYCERIN (NITROSTAT) SL tablet 0.4 mg  0.4 mg Sublingual Q5 min PRN Jay Schlichter, MD      . ondansetron [facility]) injection 4 mg  4 mg Intravenous Q6H PRN Jay Schlichter, MD      . oxyCODONE (Oxy IR/ROXICODONE) immediate release tablet 5 mg  5 mg Oral Q6H  PRN Almyra Deforest, PA   5 mg at 01/07/15 0857  . oxyCODONE-acetaminophen (PERCOCET/ROXICET) 5-325 MG per tablet 1-2 tablet  1-2 tablet Oral Q4H PRN Gabriel Earing, PA-C   2 tablet at 01/07/15 1203  . pantoprazole (PROTONIX) EC tablet 40 mg  40 mg Oral Daily [name], MD   40 mg at 01/07/15 0831  . phenol (CHLORASEPTIC) mouth spray 1 spray  1 spray Mouth/Throat PRN [name], MD   1 spray at 01/05/15 0546  . prasugrel (EFFIENT) tablet 10 mg  10 mg Oral Daily Jay Schlichter, MD   10 mg at 01/07/15 0831  . sodium chloride 0.9 % injection 3 mL  3 mL Intravenous Q12H Lorretta Harp, MD   3 mL at 01/07/15 0175  . sodium chloride 0.9 % injection 3 mL  3 mL Intravenous PRN Lorretta Harp, MD      . traMADol Veatrice Bourbon) tablet 50-100 mg  50-100 mg Oral Q6H PRN Evelene Croon Barrett, PA-C   100 mg at 01/05/15 1025    PE: General appearance: alert, cooperative, no distress and  moderately obese Neck: no carotid bruit and no JVD Lungs: clear to auscultation bilaterally Heart: regular rate and rhythm, S1, S2 normal, no murmur, click, rub or gallop Extremities: s/p right forearm fasciotomy. No LEE Pulses: 2+ and symmetric Skin: warm and dry Neurologic: Grossly normal  Lab Results:   Recent Labs  01/05/15 0028  WBC 17.2*  HGB 15.2  HCT 45.2  PLT 196   BMET  Recent Labs  01/05/15 0028  NA 135  K 4.4  CL 101  CO2 26  GLUCOSE 123*  BUN 8  CREATININE 1.21  CALCIUM 9.1     Assessment/Plan   Principal Problem:   Unstable angina Active Problems:   Essential hypertension   Hyperlipidemia   S/P coronary artery stent placement   Chest pain   Compartment syndrome of right upper extremity  1. Chest pain- no obstructive dx on cath 9/12 (patent stent in LCx and RCA, 30% prox to mid LAD, normal EF) - on ASA, lipitor, metoprolol and effient.  - ? If pain was 2/2 coronary vasospasm. ? Addition of CCB or LA Nitrate.   2. Compartment syndrome of RUE post cath - occurred after spasm of radial artery during sheath pull.  - urgent vascular consult, seen by Dr. Oneida Alar 9/12, taken to OR on the same day for R forearm fasciotomy - Seen by Dr. Oneida Alar today and has been cleared from a surgical standpoint for discharge. Will f/u with Dr. Oneida Alar in 2 weeks. Will Rx percocet 5-325 #15 with no refills, PRN for pain.  3. Acute respiratory distress pulmonary critical care consulted after pt developed distress after he was extubated following fasciotomy, felt to be due to laryngospasm and negative pressure edema. Later extubated again and stable since   4. CAD S/p BMS 2012 to LCx and DES to RCA 01/2014. Patent stents on recent cath. Nonobstructive CAD.   5. HTN BP is controlled.   6. HLD on statin.     LOS: 5 days    Brittainy M. Ladoris Gene 01/07/2015 12:16 PM  The patient was seen, examined and discussed  with Dineen Kid, PA-C and I agree with the above.    44 year old male with known CAD, prior BMS to [place] and DES to mid-RCA-2015 who presented with a typical angina, treated as UA, on heparin drip, negative troponin and no significant ECG changes. LHC on 9/12 showed  non-obstructive CAD, but the patient developed a compartment syndrome of the right forearm and was taken to the OR post cath. Pain controlled with morphine, improved overnight. No chest pain. Fasciotomy closure done yesterday. He has full range of motion in his fingers.  Crea stable post cath at 1.2. Continue ASA, atorvastatin, metoprolol, Effient. Add imdur 15 mg po daily for angina, possible spasm.   [name] 01/07/2015

## 2015-01-07 NOTE — Telephone Encounter (Signed)
-----   Message from [name], MD sent at 01/07/2015  8:42 AM EDT ----- Needs follow up appt in 2 weeks to check incision  Angel Gutierrez

## 2015-01-07 NOTE — Discharge Summary (Signed)
Physician Discharge Summary  Patient ID: Angel Gutierrez MRN: [id number] DOB/AGE: 08/21/1970 44 y.o.  Admit date: 01/01/2015 Discharge date: 01/07/2015  Admission Diagnoses: Unstable Angina   Discharge Diagnoses:  Principal Problem:   Unstable angina Active Problems:   Essential hypertension   Hyperlipidemia   S/P coronary artery stent placement   Chest pain   Compartment syndrome of right upper extremity   Discharged Condition: stable  Hospital Course:  Angel Gutierrez is a 44 y.o. male with a history of non-STEMI in 2012 with BMS to the CFX, followed by DES to the RCA in 2015 and preserved left ventricular function. He presented to [facility] ED on 01/01/15 with a complaint of severe chest pain similar to his prior angina.  His EKG showed no acute changes. His cardiac enzymes were negative for MI. His other labs did not show any critical abnormalities. His symptoms were concerning for unstable angina so he was taken to the cath lab on 01/04/2015. Cardiac catheterization showed nonobstructive disease in the LAD and patent stents in the circumflex and RCA. His EF was normal. Dr. Gwenlyn Found evaluated the films and felt that his symptoms were most likely noncardiac in origin.   His post cath recover was complicated by severe right forearm pain/ swelling following removal of right radial cath sheath. He developed right forearm compartment syndrome requiring surgical decompression/faciotomy by Dr. Oneida Alar. He was intubated for his surgery and had difficulty with initial extubation secondary to laryngospasm and developed acute respiratory distress and had to be re-intubated. However, his second attempt at extubation was successful and he had no further respiratory issues.   His right forearm was also stable, post surgery. He was seen by Dr. Oneida Alar who cleared him for discharge from a surgical standpoint. He was last seen and examined by Dr. Meda Coffee who determined he was stable for discharge from a cardiac standpoint.  He was continued on ASA, Effeient, BB, ARB statin and LA nitrate. Dr. Meda Coffee approved for Rx for PRN percocet for pain as well as RNR Xanax with no refills.   He has  F/u with Dr. Oneida Alar in 2 week and 3 week f/u in our office with an [place]. He is scheduled to follow-up with Dr. Gwenlyn Found in 6 weeks.   Consults: vascular surgery  Significant Diagnostic Studies: [facility] 01/04/15 Coronary Findings    Dominance: Right   Left Anterior Descending   . Prox LAD to Mid LAD lesion, 35% stenosed.     Left Circumflex   . Prox Cx to Mid Cx lesion, 0% stenosed. Previously placed Prox Cx to Mid Cx stent (unknown type) is patent.     Right Coronary Artery   . Prox RCA to Mid RCA lesion, 0% stenosed. Previously placed Prox RCA to Mid RCA stent (unknown type) is patent.       Treatments: See Hospital Course  Discharge Exam: Blood pressure 113/62, pulse 76, temperature 99 F (37.2 C), temperature source Oral, resp. rate 16, height 6\' 1"  (1.854 m), weight 274 lb (124.286 kg), SpO2 94 %.   Disposition: 01-Home or Self Care      Discharge Instructions    Diet - low sodium heart healthy    Complete by:  As directed      Diet - low sodium heart healthy    Complete by:  As directed      Increase activity slowly    Complete by:  As directed      Increase activity slowly    Complete by:  As directed  Medication List    STOP taking these medications        isosorbide mononitrate 30 MG 24 hr tablet  Commonly known as:  IMDUR      TAKE these medications        albuterol 108 (90 BASE) MCG/ACT inhaler  Commonly known as:  PROVENTIL HFA;VENTOLIN HFA  Inhale 2 puffs into the lungs every 4 (four) hours as needed for wheezing or shortness of breath.     ALPRAZolam 0.25 MG tablet  Commonly known as:  XANAX  Take 1 tablet (0.25 mg total) by mouth 2 (two) times daily as needed for anxiety.     ALPRAZolam 0.25 MG tablet  Commonly known as:  XANAX  Take 1 tablet (0.25 mg total) by  mouth at bedtime as needed for anxiety.     aspirin 81 MG EC tablet  Take 1 tablet (81 mg total) by mouth daily.     atorvastatin 40 MG tablet  Commonly known as:  LIPITOR  Take 1 tablet by mouth daily.     bisoprolol 5 MG tablet  Commonly known as:  ZEBETA  Take 0.5 tablets (2.5 mg total) by mouth daily.     buPROPion 150 MG 12 hr tablet  Commonly known as:  WELLBUTRIN SR  Take 150 mg by mouth daily.     clobetasol cream 0.05 %  Commonly known as:  TEMOVATE  Apply 1 application topically as needed (FOR PRURITUS).     multivitamin with minerals tablet  Take 1 tablet by mouth daily.     nitroGLYCERIN 0.4 MG SL tablet  Commonly known as:  NITROSTAT  Place 0.4 mg under the tongue every 5 (five) minutes as needed for chest pain.     oxyCODONE-acetaminophen 5-325 MG per tablet  Commonly known as:  PERCOCET/ROXICET  Take 1-2 tablets by mouth every 4 (four) hours as needed for moderate pain or severe pain.     prasugrel 10 MG Tabs tablet  Commonly known as:  EFFIENT  Take 1 tablet (10 mg total) by mouth daily.     valsartan 160 MG tablet  Commonly known as:  DIOVAN  Take 1 tablet (160 mg total) by mouth daily.       Follow-up Information    Follow up with Ruta Hinds, MD On 01/21/2015.   Specialties:  Vascular Surgery, Cardiology   Why:  2:30 PM   Contact information:   [address] [place] [address] [phone]       Follow up with Rosaria Ferries, PA-C On 01/26/2015.   Specialties:  Cardiology, Radiology   Why:  1:30 PM   Contact information:   [place]  [address] [phone]       Follow up with Quay Burow, MD On 02/23/2015.   Specialties:  Cardiology, Radiology   Why:  11:15 AM    Contact information:   [address] [place] [address] [place]: > 30 MINUTES  Signed: Lyda Jester 01/07/2015, 1:31 PM

## 2015-01-07 NOTE — Telephone Encounter (Signed)
Can this encounter be closed?

## 2015-01-07 NOTE — Discharge Instructions (Signed)

## 2015-01-12 ENCOUNTER — Ambulatory Visit: Payer: BLUE CROSS/BLUE SHIELD | Admitting: Physician Assistant

## 2015-01-12 ENCOUNTER — Ambulatory Visit: Payer: BLUE CROSS/BLUE SHIELD | Admitting: Cardiovascular Disease

## 2015-01-13 DIAGNOSIS — Z0279 Encounter for issue of other medical certificate: Secondary | ICD-10-CM

## 2015-01-19 ENCOUNTER — Encounter: Payer: Self-pay | Admitting: Vascular Surgery

## 2015-01-21 ENCOUNTER — Ambulatory Visit (INDEPENDENT_AMBULATORY_CARE_PROVIDER_SITE_OTHER): Payer: BLUE CROSS/BLUE SHIELD | Admitting: Vascular Surgery

## 2015-01-21 ENCOUNTER — Encounter: Payer: Self-pay | Admitting: Vascular Surgery

## 2015-01-21 VITALS — BP 131/85 | HR 96 | Ht 73.0 in | Wt 280.0 lb

## 2015-01-21 DIAGNOSIS — T79A11D Traumatic compartment syndrome of right upper extremity, subsequent encounter: Secondary | ICD-10-CM

## 2015-01-21 NOTE — Progress Notes (Signed)
A 44 year old male who returns for follow-up today. He underwent a right forearm fasciotomy for compartment syndrome after a right radial artery cardiac catheterization by Dr. Gwenlyn Found. The patient states he still has some deep muscle soreness in the right forearm but has no numbness or tingling in his hand. He has had no incisional drainage.  Physical exam:  Filed Vitals:   01/21/15 1358  BP: 131/85  Pulse: 96  Height: 6\' 1"  (1.854 m)  Weight: 280 lb (127.007 kg)  SpO2: 96%    Right upper extremity: Well-healed right forearm incision 2+ right radial pulse hand intrinsics intact  Assessment: Continuing to recover from right forearm compartment syndrome status post fasciotomy.  Plan: Follow-up as needed. The patient will return to work in a few more weeks after he has full incisional strength.  Ruta Hinds, MD [facility] Office: [phone] Pager: [phone]

## 2015-01-25 ENCOUNTER — Other Ambulatory Visit: Payer: Self-pay | Admitting: Cardiology

## 2015-01-25 NOTE — Telephone Encounter (Signed)
Xanax refill refused - defer to PCP

## 2015-01-26 ENCOUNTER — Ambulatory Visit (INDEPENDENT_AMBULATORY_CARE_PROVIDER_SITE_OTHER): Payer: BLUE CROSS/BLUE SHIELD | Admitting: Physician Assistant

## 2015-01-26 ENCOUNTER — Encounter: Payer: Self-pay | Admitting: Physician Assistant

## 2015-01-26 VITALS — BP 126/82 | HR 94 | Ht 72.0 in | Wt 284.2 lb

## 2015-01-26 DIAGNOSIS — Z9889 Other specified postprocedural states: Secondary | ICD-10-CM

## 2015-01-26 DIAGNOSIS — I25118 Atherosclerotic heart disease of native coronary artery with other forms of angina pectoris: Secondary | ICD-10-CM | POA: Diagnosis not present

## 2015-01-26 MED ORDER — ISOSORBIDE MONONITRATE ER 30 MG PO TB24: 15.0000 mg | ORAL_TABLET | Freq: Every day | ORAL | Status: AC

## 2015-01-26 NOTE — Progress Notes (Signed)
Cardiology Office Note   Date:  01/26/2015   ID:  Angel Gutierrez, DOB 06/22/1970, MRN [id number]  PCP:  Angel Pickler, DO  Cardiologist:  Dr Angel Gardner, PA-C   Chief Complaint  Patient presents with  . Follow-up    2-3 week post hosp.//has gained 7 pounds in 5 days//pt states no other Sx.    History of Present Illness: Angel Gutierrez is a 44 y.o. male with a history of non-STEMI 2012 with BMS to the CFX, DES to the RCA in 2015 and preserved left ventricular function. Cath 01/04/2015 for chest pain was non-obs w/ patent stents. Had compartment syndrome in his R arm after cath>>Vascular surgery>>fasciotomy>>closure, finally discharged 09/15.   Angel Gutierrez presents for post-hospital followup  Since discharge from the hospital, Angel Gutierrez has done well. His arm was painful for a while but then has begun to improve.  He is getting a headache from the isosorbide, but has not had any more chest pain and states the headache will improve with Tylenol. He is willing to keep taking it as he feels it helps him.  He has gained 15 pounds in the last several weeks. He admits that he has not been exercising, and has been eating poorly. He admits to a high sodium diet and has noticed some lower extremity edema recently.   He is a little concerned about his incision as he had a tiny amount of drainage from it yesterday and it is a little bit red at the ends. He states Dr. Oneida Alar told him that was probably from the dissolving stitches causing some irritation.  Past Medical History  Diagnosis Date  . Coronary artery disease     a.  s/p BMS to LCx in (2012); DES to RCA on 01/27/14  . Hypertension   . Hyperlipidemia   . Myocardial infarction 10/12/2010  . Asthma     Past Surgical History  Procedure Laterality Date  . Coronary stent placement  09/2010    BMS to the CFX  . Left heart catheterization with coronary angiogram N/A 01/27/2014    Procedure: LEFT HEART CATHETERIZATION WITH  CORONARY ANGIOGRAM;  Surgeon: Angel M Martinique, MD;  Location: [facility] CATH LAB;  Service: Cardiovascular;  Laterality: N/A;  . Percutaneous coronary stent intervention (pci-s)  01/27/2014    Procedure: PERCUTANEOUS CORONARY STENT INTERVENTION (PCI-S);  Surgeon: Angel M Martinique, MD;  Location[facility] CATH LAB;  Service: Cardiovascular;;  . Left heart catheterization with coronary angiogram N/A 01/30/2014    Procedure: LEFT HEART CATHETERIZATION WITH CORONARY ANGIOGRAM;  Surgeon: Angel Booze, MD;  Location: [facility] CATH LAB;  Service: Cardiovascular;  Laterality: N/A;  . Cardiac catheterization  01/04/2015    Patent stents in the RCA and CFX, LAD 35%, preserved LV function  . Cardiac catheterization N/A 01/04/2015    Procedure: Left Heart Cath and Coronary Angiography;  Surgeon: Angel Harp, MD;  Location: [place] CV LAB;  Service: Cardiovascular;  Laterality: N/A;  . Fasciotomy Right 01/04/2015    Procedure: Left Forearm Fasciotomy;  Surgeon: Angel Dutch, MD;  Location: [facility];  Service: Vascular;  Laterality: Right;  . Fasciotomy closure Right 01/06/2015    Procedure: RIGHT ARM FASCIOTOMY CLOSURE;  Surgeon: Angel Dutch, MD;  Location: [facility] OR;  Service: Vascular;  Laterality: Right;    Current Outpatient Prescriptions  Medication Sig Dispense Refill  . albuterol (PROVENTIL HFA;VENTOLIN HFA) 108 (90 BASE) MCG/ACT inhaler Inhale 2 puffs into the lungs every 4 (four) hours  as needed for wheezing or shortness of breath.    Marland Kitchen atorvastatin (LIPITOR) 40 MG tablet Take 1 tablet by mouth daily.  1  . bisoprolol (ZEBETA) 5 MG tablet Take 0.5 tablets (2.5 mg total) by mouth daily. 30 tablet 6  . Multiple Vitamins-Minerals (MULTIVITAMIN WITH MINERALS) tablet Take 1 tablet by mouth daily.    . nitroGLYCERIN (NITROSTAT) 0.4 MG SL tablet Place 0.4 mg under the tongue every 5 (five) minutes as needed for chest pain.    . prasugrel (EFFIENT) 10 MG TABS tablet Take 1 tablet (10 mg total) by mouth daily. 30  tablet 10  . valsartan (DIOVAN) 160 MG tablet Take 1 tablet (160 mg total) by mouth daily. 30 tablet 11  . isosorbide mononitrate (IMDUR) 30 MG 24 hr tablet Take 0.5 tablets (15 mg total) by mouth daily. 45 tablet 3   No current facility-administered medications for this visit.    Allergies:   Eggs or egg-derived products; Influenza vaccines; Metoprolol; and Vicodin    Social History:  The patient  reports that he quit smoking about 3 years ago. His smoking use included Cigarettes. He has a 22 pack-year smoking history. He has never used smokeless tobacco. He reports that he drinks alcohol. He reports that he does not use illicit drugs.   Family History:  The patient's family history includes Cancer in his father; Heart attack in his father and paternal grandfather; Heart disease in his father and paternal grandfather; Hypertension in his mother.    ROS:  Please see the history of present illness. All other systems are reviewed and negative.    PHYSICAL EXAM: VS:  BP 126/82 mmHg  Pulse 94  Ht 6' (1.829 m)  Wt 284 lb 3.2 oz (128.912 kg)  BMI 38.54 kg/m2 , BMI Body mass index is 38.54 kg/(m^2). GEN: Well nourished, well developed, in no acute distress HEENT: normal Neck: no JVD, carotid bruits, or masses Cardiac: RRR; no murmurs, rubs, or gallops,  1+ lower extremity edema, distal pulses are intact in all extremities  Respiratory:  clear to auscultation bilaterally, normal work of breathing GI: soft, nontender, nondistended, + BS MS: no deformity or atrophy; incision in the right forearm is healing well, there is minimal erythema around the ends, no drainage or pus filled areas noted Skin: warm and dry, no rash Neuro:  Strength and sensation are intact Psych: euthymic mood, full affect   EKG:  EKG is not ordered today.  Recent Labs: 01/27/2014: Pro B Natriuretic peptide (BNP) 21.6 01/02/2015: TSH 2.327 01/05/2015: BUN 8; Creatinine, Ser 1.21; Hemoglobin 15.2; Platelets 196;  Potassium 4.4; Sodium 135    Lipid Panel    Component Value Date/Time   CHOL 159 01/27/2014 0625   TRIG 56 01/27/2014 0625   HDL 53 01/27/2014 0625   CHOLHDL 3.0 01/27/2014 0625   VLDL 11 01/27/2014 0625   LDLCALC 95 01/27/2014 0625     Wt Readings from Last 3 Encounters:  01/26/15 284 lb 3.2 oz (128.912 kg)  01/21/15 280 lb (127.007 kg)  01/07/15 274 lb (124.286 kg)     Other studies Reviewed: Additional studies/ records that were reviewed today include: Hospital records, cath report and surgical notes.  ASSESSMENT AND PLAN:  1.  CAD: He is not having any ischemic symptoms. Stents were patent by recent catheterization. He is on aspirin, Effient, statin, beta blocker and nitrates. He is getting a mild headache from the nitrates but feels his symptoms are improved with it so no change to  his medications at this time.  Discussed the possibility of small vessel disease, but also advised that we would never be able to confirm this as there is no good test for it.  Made him aware that since he no longer feels increased stress from the recent death of close relative, he can stop the isosorbide if he wishes and see if his symptoms remain well-controlled. Currently, he wishes to keep taking it.  2. Compartment syndrome right upper extremity post cath: He has a follow-up with Dr. Oneida Alar scheduled. He is to mark the areas of erythema and call Dr. Oneida Alar if it is spreading. Currently, antibiotics are not indicated.  3. Weight gain: He has no symptoms of volume overload by exam except for some mild lower extremity edema. We'll not make any medication changes at this time but he is encouraged to increase his activity level, stick more tightly to a heart healthy diet and limit  sodium.   Current medicines are reviewed at length with the patient today.  The patient does not have concerns regarding medicines.  The following changes have been made:  no change  Labs/ tests ordered today  include:  No orders of the defined types were placed in this encounter.     Disposition:   FU with Dr. Gwenlyn Found as scheduled  Signed, Lenoard Aden  01/26/2015 2:23 PM    [facility] [place], [place], [place]  [address] Phone: [phone]; Fax: [phone]

## 2015-01-26 NOTE — Patient Instructions (Signed)
Your physician has recommended you make the following change in your medication: START ISOSORBIDE 30MG  - TAKE 1/2 TABLET DAILY.  [place] 02/23/15 AT 11:15AM.  Low-Sodium Eating Plan Sodium raises blood pressure and causes water to be held in the body. Getting less sodium from food will help lower your blood pressure, reduce any swelling, and protect your heart, liver, and kidneys. We get sodium by adding salt (sodium chloride) to food. Most of our sodium comes from canned, boxed, and frozen foods. Restaurant foods, fast foods, and pizza are also very high in sodium. Even if you take medicine to lower your blood pressure or to reduce fluid in your body, getting less sodium from your food is important. WHAT IS MY PLAN? Most people should limit their sodium intake to 2,300 mg a day. Your health care provider recommends that you limit your sodium intake to __2000________ a day.  WHAT DO I NEED TO KNOW ABOUT THIS EATING PLAN? For the low-sodium eating plan, you will follow these general guidelines:  Choose foods with a % Daily Value for sodium of less than 5% (as listed on the food label).   Use salt-free seasonings or herbs instead of table salt or sea salt.   Check with your health care provider or pharmacist before using salt substitutes.   Eat fresh foods.  Eat more vegetables and fruits.  Limit canned vegetables. If you do use them, rinse them well to decrease the sodium.   Limit cheese to 1 oz (28 g) per day.   Eat lower-sodium products, often labeled as "lower sodium" or "no salt added."  Avoid foods that contain monosodium glutamate (MSG). MSG is sometimes added to [place] food and some canned foods.  Check food labels (Nutrition Facts labels) on foods to learn how much sodium is in one serving.  Eat more home-cooked food and less restaurant, buffet, and fast food.  When eating at a restaurant, ask that your food be prepared with less salt or  none, if possible.  HOW DO I READ FOOD LABELS FOR SODIUM INFORMATION? The Nutrition Facts label lists the amount of sodium in one serving of the food. If you eat more than one serving, you must multiply the listed amount of sodium by the number of servings. Food labels may also identify foods as:  Sodium free--Less than 5 mg in a serving.  Very low sodium--35 mg or less in a serving.  Low sodium--140 mg or less in a serving.  Light in sodium--50% less sodium in a serving. For example, if a food that usually has 300 mg of sodium is changed to become light in sodium, it will have 150 mg of sodium.  Reduced sodium--25% less sodium in a serving. For example, if a food that usually has 400 mg of sodium is changed to reduced sodium, it will have 300 mg of sodium. WHAT FOODS CAN I EAT? Grains Low-sodium cereals, including oats, puffed wheat and rice, and shredded wheat cereals. Low-sodium crackers. Unsalted rice and pasta. Lower-sodium bread.  Vegetables Frozen or fresh vegetables. Low-sodium or reduced-sodium canned vegetables. Low-sodium or reduced-sodium tomato sauce and paste. Low-sodium or reduced-sodium tomato and vegetable juices.  Fruits Fresh, frozen, and canned fruit. Fruit juice.  Meat and Other Protein Products Low-sodium canned tuna and salmon. Fresh or frozen meat, poultry, seafood, and fish. Lamb. Unsalted nuts. Dried beans, peas, and lentils without added salt. Unsalted canned beans. Homemade soups without salt. Eggs.  Dairy Milk. Soy milk. Ricotta  cheese. Low-sodium or reduced-sodium cheeses. Yogurt.  Condiments Fresh and dried herbs and spices. Salt-free seasonings. Onion and garlic powders. Low-sodium varieties of mustard and ketchup. Lemon juice.  Fats and Oils Reduced-sodium salad dressings. Unsalted butter.  Other Unsalted popcorn and pretzels.  The items listed above may not be a complete list of recommended foods or beverages. Contact your dietitian for more  options. WHAT FOODS ARE NOT RECOMMENDED? Grains Instant hot cereals. Bread stuffing, pancake, and biscuit mixes. Croutons. Seasoned rice or pasta mixes. Noodle soup cups. Boxed or frozen macaroni and cheese. Self-rising flour. Regular salted crackers. Vegetables Regular canned vegetables. Regular canned tomato sauce and paste. Regular tomato and vegetable juices. Frozen vegetables in sauces. Salted french fries. Olives. Angie Fava. Relishes. Sauerkraut. Salsa. Meat and Other Protein Products Salted, canned, smoked, spiced, or pickled meats, seafood, or fish. Bacon, ham, sausage, hot dogs, corned beef, chipped beef, and packaged luncheon meats. Salt pork. Jerky. Pickled herring. Anchovies, regular canned tuna, and sardines. Salted nuts. Dairy Processed cheese and cheese spreads. Cheese curds. Blue cheese and cottage cheese. Buttermilk.  Condiments Onion and garlic salt, seasoned salt, table salt, and sea salt. Canned and packaged gravies. Worcestershire sauce. Tartar sauce. Barbecue sauce. Teriyaki sauce. Soy sauce, including reduced sodium. Steak sauce. Fish sauce. Oyster sauce. Cocktail sauce. Horseradish. Regular ketchup and mustard. Meat flavorings and tenderizers. Bouillon cubes. Hot sauce. Tabasco sauce. Marinades. Taco seasonings. Relishes. Fats and Oils Regular salad dressings. Salted butter. Margarine. Ghee. Bacon fat.  Other Potato and tortilla chips. Corn chips and puffs. Salted popcorn and pretzels. Canned or dried soups. Pizza. Frozen entrees and pot pies.  The items listed above may not be a complete list of foods and beverages to avoid. Contact your dietitian for more information. Document Released: 09/30/2001 Document Revised: 04/15/2013 Document Reviewed: 02/12/2013 [facility] Patient Information 2015 [place], [place]. This information is not intended to replace advice given to you by your health care provider. Make sure you discuss any questions you have with your health care  provider.

## 2015-01-27 ENCOUNTER — Telehealth: Payer: Self-pay

## 2015-01-27 NOTE — Telephone Encounter (Signed)
Phone call from pt.  Reported he saw his Cardiologist yesterday, and was recommended to call Dr. Oneida Alar office for possible infection in right arm incision.  Pt. stated the right arm incision appears red at each end, is sore to touch, and has small amt. thin, yellow drainage.  Denied fever/ chills.  Requested an antibiotic be ordered.  Advised will need to evaluate in the office to determine necessity of antibiotic.  Appt. offered for 01/28/15 @ 9:00 AM with nurse practitioner.  Agreed with plan.

## 2015-01-28 ENCOUNTER — Encounter: Payer: Self-pay | Admitting: Family

## 2015-01-28 ENCOUNTER — Ambulatory Visit (INDEPENDENT_AMBULATORY_CARE_PROVIDER_SITE_OTHER): Payer: BLUE CROSS/BLUE SHIELD | Admitting: Family

## 2015-01-28 VITALS — BP 125/85 | HR 86 | Temp 99.3°F | Resp 16 | Ht 72.0 in | Wt 282.0 lb

## 2015-01-28 DIAGNOSIS — T79A11D Traumatic compartment syndrome of right upper extremity, subsequent encounter: Secondary | ICD-10-CM

## 2015-01-28 DIAGNOSIS — Z9889 Other specified postprocedural states: Secondary | ICD-10-CM

## 2015-01-28 DIAGNOSIS — T148 Other injury of unspecified body region: Secondary | ICD-10-CM

## 2015-01-28 DIAGNOSIS — M79601 Pain in right arm: Secondary | ICD-10-CM

## 2015-01-28 DIAGNOSIS — T148XXA Other injury of unspecified body region, initial encounter: Secondary | ICD-10-CM | POA: Insufficient documentation

## 2015-01-28 DIAGNOSIS — L24A9 Irritant contact dermatitis due friction or contact with other specified body fluids: Secondary | ICD-10-CM | POA: Insufficient documentation

## 2015-01-28 DIAGNOSIS — M7989 Other specified soft tissue disorders: Secondary | ICD-10-CM | POA: Insufficient documentation

## 2015-01-28 NOTE — Progress Notes (Signed)
    Postoperative Visit   History of Present Illness  Angel Gutierrez is a 44 y.o. male who underwent a right forearm fasciotomy on 01/06/15 for compartment syndrome after a right radial artery cardiac catheterization by Dr. Gwenlyn Found. The patient states he still has some deep muscle soreness in the right forearm but has no numbness or tingling in his hand. Dr. Valinda Hoar last saw the patient on 01/21/15. At that time his right forearm incision was well-healed with 2+ right radial pulse, no drainage from the incision.  He was to return to work in a few more weeks after his incision had healed and his arm recovered adequate strength. He was to follow up as needed.  He returns today with c/o improving redness, improving soreness, and thin yellow drainage from his right forearm incision that started about 3 days ago and has decreased today. He denies fever or chills. He denies pain, cold sensation, tingling or numbness in his right hand.  Past Medical History, Past Surgical History, Social History, Family History, Medications, Allergies, and Review of Systems are unchanged from previous evaluation on 01/04/15.  For VQI Use Only  PRE-ADM LIVING: Home  AMB STATUS: Ambulatory  Physical Examination  There were no vitals filed for this visit. There is no weight on file to calculate BMI.  PHYSICAL EXAMINATION: General: The patient appears their stated age.   HEENT:  No gross abnormalities Pulmonary: Respirations are non-labored Abdomen: Soft and non-tender with. Musculoskeletal: There are no major deformities.   Neurologic: No focal weakness or paresthesias are detected, Skin: There are no ulcer or rashes noted. Psychiatric: The patient has normal affect. Cardiovascular: There is a regular rate and rhythm without significant murmur appreciated.   Vascular: Vessel Right Left  Radial Palpable Palpable  Ulnar Palpable Palpable  Brachial Palpable Palpable    Medical Decision Making  Angel Gutierrez  is a 44 y.o. male who presents s/p right forearm fasciotomy on 01/06/15 for compartment syndrome after a right radial artery cardiac catheterization by Dr. Gwenlyn Found.   Dr. Oneida Alar spoke with and examined pt's right forearm which is healing well, no drainage, 3+ palpable right radial pulse, minimal forearm swelling, no signs of infection.   Follow up as needed.   Hideko Esselman, Sharmon Leyden, RN, MSN, FNP-C [facility] Office: [phone]  01/28/2015, 8:56 AM  Clinic MD: Oneida Alar

## 2015-02-23 ENCOUNTER — Ambulatory Visit (INDEPENDENT_AMBULATORY_CARE_PROVIDER_SITE_OTHER): Payer: BLUE CROSS/BLUE SHIELD | Admitting: Cardiovascular Disease

## 2015-02-23 ENCOUNTER — Encounter: Payer: Self-pay | Admitting: Cardiovascular Disease

## 2015-02-23 VITALS — BP 102/72 | HR 59 | Ht 72.0 in | Wt 280.0 lb

## 2015-02-23 DIAGNOSIS — E785 Hyperlipidemia, unspecified: Secondary | ICD-10-CM

## 2015-02-23 DIAGNOSIS — I2510 Atherosclerotic heart disease of native coronary artery without angina pectoris: Secondary | ICD-10-CM | POA: Diagnosis not present

## 2015-02-23 DIAGNOSIS — I2583 Coronary atherosclerosis due to lipid rich plaque: Secondary | ICD-10-CM

## 2015-02-23 DIAGNOSIS — I10 Essential (primary) hypertension: Secondary | ICD-10-CM

## 2015-02-23 NOTE — Assessment & Plan Note (Signed)
History of hypertension blood pressure measured at 102/72. He is on Zebeta and valsartan. Continue current meds at current dosing

## 2015-02-23 NOTE — Progress Notes (Signed)
02/23/2015 Angel Gutierrez   [birth year]  [id number]  Primary Physician Anselmo Pickler, DO Primary Cardiologist: Lorretta Harp MD Renae Gloss   HPI:  Angel Gutierrez is a 44 year old mild to moderately overweight married Caucasian male with history of CAD status post bare metal stent to his mid RCA in 2012 by myself, drug-eluting stent to the AV groove circumflex by Dr. Martinique last year with a follow-up cath several days later revealing all stents to be widely patent. He has a history of hypertension and hyperlipidemia. He was admitted in September 2016 with unstable angina and ruled out for myocardial infarction. He underwent diagnostic coronary arteriography by myself radially 01/04/15 revealing widely patent stents without significant CAD and normal LV function. Unfortunately, he developed a right forearm compartment syndrome and needed to be decompressed by Dr. Oneida Alar. The wound was then closed 2 days later. He was seen in follow-up several weeks ago and had markedly improved. He is no longer symptomatic from this and has a 2+ right radial pulse.   Current Outpatient Prescriptions  Medication Sig Dispense Refill  . albuterol (PROVENTIL HFA;VENTOLIN HFA) 108 (90 BASE) MCG/ACT inhaler Inhale 2 puffs into the lungs every 4 (four) hours as needed for wheezing or shortness of breath.    Marland Kitchen atorvastatin (LIPITOR) 40 MG tablet Take 1 tablet by mouth daily.  1  . bisoprolol (ZEBETA) 5 MG tablet Take 0.5 tablets (2.5 mg total) by mouth daily. 30 tablet 6  . isosorbide mononitrate (IMDUR) 30 MG 24 hr tablet Take 0.5 tablets (15 mg total) by mouth daily. 45 tablet 3  . Multiple Vitamins-Minerals (MULTIVITAMIN WITH MINERALS) tablet Take 1 tablet by mouth daily.    . nitroGLYCERIN (NITROSTAT) 0.4 MG SL tablet Place 0.4 mg under the tongue every 5 (five) minutes as needed for chest pain.    . prasugrel (EFFIENT) 10 MG TABS tablet Take 1 tablet (10 mg total) by mouth daily. 30 tablet 10  . valsartan  (DIOVAN) 160 MG tablet Take 1 tablet (160 mg total) by mouth daily. 30 tablet 11   No current facility-administered medications for this visit.    Allergies  Allergen Reactions  . Eggs Or Egg-Derived Products Anaphylaxis  . Influenza Vaccines     ALLERGY TO EGGS  . Metoprolol Shortness Of Breath    Mild increase in SOB related to asthma  . Vicodin [Hydrocodone-Acetaminophen] Nausea And Vomiting    Social History   Social History  . Marital Status: Married    Spouse Name: N/A  . Number of Children: N/A  . Years of Education: N/A   Occupational History  . Engineer    Social History Main Topics  . Smoking status: Former Smoker -- 1.00 packs/day for 22 years    Types: Cigarettes    Quit date: 01/28/2011  . Smokeless tobacco: Never Used     Comment: 1/2 - 1 PPD  . Alcohol Use: 0.0 oz/week    0 Standard drinks or equivalent per week     Comment: QUIT drinking 01/2014  . Drug Use: No  . Sexual Activity: Not on file   Other Topics Concern  . Not on file   Social History Narrative     Review of Systems: General: negative for chills, fever, night sweats or weight changes.  Cardiovascular: negative for chest pain, dyspnea on exertion, edema, orthopnea, palpitations, paroxysmal nocturnal dyspnea or shortness of breath Dermatological: negative for rash Respiratory: negative for cough or wheezing Urologic: negative for hematuria Abdominal: negative for nausea,  vomiting, diarrhea, bright red blood per rectum, melena, or hematemesis Neurologic: negative for visual changes, syncope, or dizziness All other systems reviewed and are otherwise negative except as noted above.    Blood pressure 102/72, pulse 59, height 6' (1.829 m), weight 280 lb (127.007 kg).  General appearance: alert and no distress Neck: no adenopathy, no carotid bruit, no JVD, supple, symmetrical, trachea midline and thyroid not enlarged, symmetric, no tenderness/mass/nodules Lungs: clear to auscultation  bilaterally Heart: regular rate and rhythm, S1, S2 normal, no murmur, click, rub or gallop Extremities: extremities normal, atraumatic, no cyanosis or edema and 2+ right radial pulse  EKG sinus bradycardia 59 without ST or T-wave changes. I personally reviewed this EKG  ASSESSMENT AND PLAN:   Hyperlipidemia History of hyperlipidemia on atorvastatin 40 mg a day followed by his PCP  Essential hypertension History of hypertension blood pressure measured at 102/72. He is on Zebeta and valsartan. Continue current meds at current dosing  CAD (coronary artery disease) History of CAD status post remote stenting by myself in 2012 with a bare-metal stent to his mid RCA. Dr. Martinique performed drug-eluting stenting to his AV groove circumflex October 2015. I recently catheterized him 01/04/15 revealing widely patent stents without significant CAD and normal LV function. Since his bili oh over a year since his previous stent we will stop his Effient today and continue low-dose aspirin.      Lorretta Harp MD FACP,FACC,FAHA, [facility] 02/23/2015 11:53 AM

## 2015-02-23 NOTE — Assessment & Plan Note (Signed)
History of hyperlipidemia on atorvastatin 40 mg a day followed by his PCP 

## 2015-02-23 NOTE — Patient Instructions (Addendum)
Medication Instructions:  Your physician has recommended you make the following change in your medication:  1) STOP Effient    Labwork: I will get your lab work from your [place].   Testing/Procedures: none  Follow-Up: Your physician wants you to follow-up in: 12 months with Dr. Gwenlyn Found. You will receive a reminder letter in the mail two months in advance. If you don't receive a letter, please call our office to schedule the follow-up appointment.   Any Other Special Instructions Will Be Listed Below (If Applicable).     If you need a refill on your cardiac medications before your next appointment, please call your pharmacy.

## 2015-02-23 NOTE — Assessment & Plan Note (Signed)
History of CAD status post remote stenting by myself in 2012 with a bare-metal stent to his mid RCA. Dr. Martinique performed drug-eluting stenting to his AV groove circumflex October 2015. I recently catheterized him 01/04/15 revealing widely patent stents without significant CAD and normal LV function. Since his bili oh over a year since his previous stent we will stop his Effient today and continue low-dose aspirin.

## 2015-04-18 ENCOUNTER — Encounter (HOSPITAL_BASED_OUTPATIENT_CLINIC_OR_DEPARTMENT_OTHER): Payer: Self-pay

## 2015-04-18 ENCOUNTER — Emergency Department (HOSPITAL_BASED_OUTPATIENT_CLINIC_OR_DEPARTMENT_OTHER)
Admission: EM | Admit: 2015-04-18 | Discharge: 2015-04-18 | Disposition: A | Discharge status: Home / Self Care | Payer: BLUE CROSS/BLUE SHIELD | Attending: Emergency Medicine | Admitting: Emergency Medicine

## 2015-04-18 DIAGNOSIS — Y998 Other external cause status: Secondary | ICD-10-CM | POA: Diagnosis not present

## 2015-04-18 DIAGNOSIS — E785 Hyperlipidemia, unspecified: Secondary | ICD-10-CM | POA: Diagnosis not present

## 2015-04-18 DIAGNOSIS — T782XXA Anaphylactic shock, unspecified, initial encounter: Secondary | ICD-10-CM | POA: Diagnosis not present

## 2015-04-18 DIAGNOSIS — Y9289 Other specified places as the place of occurrence of the external cause: Secondary | ICD-10-CM | POA: Insufficient documentation

## 2015-04-18 DIAGNOSIS — Y9389 Activity, other specified: Secondary | ICD-10-CM | POA: Diagnosis not present

## 2015-04-18 DIAGNOSIS — Z9889 Other specified postprocedural states: Secondary | ICD-10-CM | POA: Insufficient documentation

## 2015-04-18 DIAGNOSIS — Z87891 Personal history of nicotine dependence: Secondary | ICD-10-CM | POA: Diagnosis not present

## 2015-04-18 DIAGNOSIS — I2510 Atherosclerotic heart disease of native coronary artery without angina pectoris: Secondary | ICD-10-CM | POA: Insufficient documentation

## 2015-04-18 DIAGNOSIS — Z79899 Other long term (current) drug therapy: Secondary | ICD-10-CM | POA: Insufficient documentation

## 2015-04-18 DIAGNOSIS — X58XXXA Exposure to other specified factors, initial encounter: Secondary | ICD-10-CM | POA: Insufficient documentation

## 2015-04-18 DIAGNOSIS — F419 Anxiety disorder, unspecified: Secondary | ICD-10-CM | POA: Insufficient documentation

## 2015-04-18 DIAGNOSIS — I10 Essential (primary) hypertension: Secondary | ICD-10-CM | POA: Diagnosis not present

## 2015-04-18 DIAGNOSIS — R000 Tachycardia, unspecified: Secondary | ICD-10-CM | POA: Diagnosis not present

## 2015-04-18 DIAGNOSIS — J45901 Unspecified asthma with (acute) exacerbation: Secondary | ICD-10-CM | POA: Insufficient documentation

## 2015-04-18 DIAGNOSIS — I252 Old myocardial infarction: Secondary | ICD-10-CM | POA: Diagnosis not present

## 2015-04-18 DIAGNOSIS — T7840XA Allergy, unspecified, initial encounter: Secondary | ICD-10-CM | POA: Diagnosis present

## 2015-04-18 MED ORDER — EPINEPHRINE 0.3 MG/0.3ML IJ SOAJ: 0.3000 mg | Freq: Once | INTRAMUSCULAR | Status: AC

## 2015-04-18 MED ORDER — DIPHENHYDRAMINE HCL 25 MG PO TABS: 50.0000 mg | ORAL_TABLET | Freq: Four times a day (QID) | ORAL | Status: DC

## 2015-04-18 MED ORDER — EPINEPHRINE HCL 0.1 MG/ML IJ SOSY
0.3000 mg | PREFILLED_SYRINGE | Freq: Once | INTRAMUSCULAR | Status: AC
  Administered 2015-04-18: 0.3 mg via INTRAMUSCULAR

## 2015-04-18 MED ORDER — PREDNISONE 10 MG PO TABS: 50.0000 mg | ORAL_TABLET | Freq: Every day | ORAL | Status: DC

## 2015-04-18 MED ORDER — RANITIDINE HCL 150 MG PO TABS: 150.0000 mg | ORAL_TABLET | Freq: Two times a day (BID) | ORAL | Status: DC

## 2015-04-18 MED ORDER — FAMOTIDINE IN NACL 20-0.9 MG/50ML-% IV SOLN
20.0000 mg | Freq: Once | INTRAVENOUS | Status: AC
  Administered 2015-04-18: 20 mg via INTRAVENOUS
  Filled 2015-04-18: qty 50

## 2015-04-18 MED ORDER — EPINEPHRINE 0.3 MG/0.3ML IJ SOAJ
INTRAMUSCULAR | Status: AC
  Filled 2015-04-18: qty 0.3

## 2015-04-18 MED ORDER — METHYLPREDNISOLONE SODIUM SUCC 125 MG IJ SOLR
125.0000 mg | Freq: Once | INTRAMUSCULAR | Status: AC
  Administered 2015-04-18: 125 mg via INTRAVENOUS
  Filled 2015-04-18: qty 2

## 2015-04-18 MED ORDER — DIPHENHYDRAMINE HCL 50 MG/ML IJ SOLN: 25.0000 mg | Freq: Once | INTRAMUSCULAR | Status: DC

## 2015-04-18 MED ORDER — DIPHENHYDRAMINE HCL 50 MG/ML IJ SOLN
50.0000 mg | Freq: Once | INTRAMUSCULAR | Status: AC
  Administered 2015-04-18: 50 mg via INTRAVENOUS
  Filled 2015-04-18: qty 1

## 2015-04-18 NOTE — ED Notes (Signed)
Patient here with bilateral eye swelling and stating that he cannot breath. Speaking complete sentences and hyperventilating. Had 2 po benadryl and claritin after noticing the throat tightness and eye swelling. Patient thinks this is related to strawberries or peanut candy he ate at breakfast

## 2015-04-18 NOTE — ED Notes (Signed)
Patient asked to change into a gown.  

## 2015-04-18 NOTE — ED Provider Notes (Signed)
CSN: [id number]     Arrival date & time 04/18/15  1146 History   First MD Initiated Contact with Patient 04/18/15 1153     Chief Complaint  Patient presents with  . Allergic Reaction     (Consider location/radiation/quality/duration/timing/severity/associated sxs/prior Treatment) Patient is a 44 y.o. male presenting with allergic reaction. The history is provided by the patient.  Allergic Reaction Presenting symptoms: difficulty breathing, itching, rash, swelling (of face) and wheezing   Difficulty breathing:    Severity:  Moderate   Onset quality:  Sudden   Timing:  Constant Swelling:    Location:  Face and mouth   Onset quality:  Sudden Severity:  Moderate Prior allergic episodes:  Food/nut allergies and allergies to medications Context: food   Relieved by:  Antihistamines Worsened by:  Nothing tried   Past Medical History  Diagnosis Date  . Coronary artery disease     a.  s/p BMS to LCx in (2012); DES to RCA on 01/27/14  . Hypertension   . Hyperlipidemia   . Myocardial infarction ([place]) 10/12/2010  . Asthma    Past Surgical History  Procedure Laterality Date  . Coronary stent placement  09/2010    BMS to the CFX  . Left heart catheterization with coronary angiogram N/A 01/27/2014    Procedure: LEFT HEART CATHETERIZATION WITH CORONARY ANGIOGRAM;  Surgeon: Peter M Martinique, MD;  Location: [facility] CATH LAB;  Service: Cardiovascular;  Laterality: N/A;  . Percutaneous coronary stent intervention (pci-s)  01/27/2014    Procedure: PERCUTANEOUS CORONARY STENT INTERVENTION (PCI-S);  Surgeon: Peter M Martinique, MD;  Location: [facility] CATH LAB;  Service: Cardiovascular;;  . Left heart catheterization with coronary angiogram N/A 01/30/2014    Procedure: LEFT HEART CATHETERIZATION WITH CORONARY ANGIOGRAM;  Surgeon: Jettie Booze, MD;  Location: [facility] CATH LAB;  Service: Cardiovascular;  Laterality: N/A;  . Cardiac catheterization  01/04/2015    Patent stents in the RCA and CFX, LAD 35%,  preserved LV function  . Cardiac catheterization N/A 01/04/2015    Procedure: Left Heart Cath and Coronary Angiography;  Surgeon: Lorretta Harp, MD;  Location: [place] CV LAB;  Service: Cardiovascular;  Laterality: N/A;  . Fasciotomy Right 01/04/2015    Procedure: Left Forearm Fasciotomy;  Surgeon: [name], MD;  Location: [place];  Service: Vascular;  Laterality: Right;  . Fasciotomy closure Right 01/06/2015    Procedure: RIGHT ARM FASCIOTOMY CLOSURE;  Surgeon: [name], MD;  Location: [facility] OR;  Service: Vascular;  Laterality: Right;   Family History  Problem Relation Age of Onset  . Cancer Father     pancreatic  . Heart attack Father   . Heart disease Father     CAD  . Heart attack Paternal Grandfather   . Heart disease Paternal Grandfather     CAD  . Hypertension Mother    Social History  Substance Use Topics  . Smoking status: Former Smoker -- 1.00 packs/day for 22 years    Types: Cigarettes    Quit date: 01/28/2011  . Smokeless tobacco: Never Used     Comment: 1/2 - 1 PPD  . Alcohol Use: 0.0 oz/week    0 Standard drinks or equivalent per week     Comment: QUIT drinking 01/2014    Review of Systems  Respiratory: Positive for wheezing.   Skin: Positive for itching and rash.  All other systems reviewed and are negative.     Allergies  Eggs or egg-derived products; Influenza vaccines; Metoprolol; and Vicodin  Home Medications   Prior to Admission medications   Medication Sig Start Date End Date Taking? Authorizing Provider  albuterol (PROVENTIL HFA;VENTOLIN HFA) 108 (90 BASE) MCG/ACT inhaler Inhale 2 puffs into the lungs every 4 (four) hours as needed for wheezing or shortness of breath.    Historical Provider, MD  atorvastatin (LIPITOR) 40 MG tablet Take 1 tablet by mouth daily. 12/10/14   Historical Provider, MD  bisoprolol (ZEBETA) 5 MG tablet Take 0.5 tablets (2.5 mg total) by mouth daily. 01/28/14   Almyra Deforest, PA  diphenhydrAMINE (BENADRYL) 25 MG  tablet Take 2 tablets (50 mg total) by mouth every 6 (six) hours. 04/18/15   Leo Grosser, MD  EPINEPHrine 0.3 mg/0.3 mL IJ SOAJ injection Inject 0.3 mLs (0.3 mg total) into the muscle once. 04/18/15   Leo Grosser, MD  isosorbide mononitrate (IMDUR) 30 MG 24 hr tablet Take 0.5 tablets (15 mg total) by mouth daily. 01/26/15   Evelene Croon Barrett, PA-C  Multiple Vitamins-Minerals (MULTIVITAMIN WITH MINERALS) tablet Take 1 tablet by mouth daily.    Historical Provider, MD  nitroGLYCERIN (NITROSTAT) 0.4 MG SL tablet Place 0.4 mg under the tongue every 5 (five) minutes as needed for chest pain.    Historical Provider, MD  predniSONE (DELTASONE) 10 MG tablet Take 5 tablets (50 mg total) by mouth daily. 04/19/15   Leo Grosser, MD  ranitidine (ZANTAC) 150 MG tablet Take 1 tablet (150 mg total) by mouth 2 (two) times daily. 04/18/15   Leo Grosser, MD  valsartan (DIOVAN) 160 MG tablet Take 1 tablet (160 mg total) by mouth daily. 06/08/14   Tanda Rockers, MD   BP 119/84 mmHg  Pulse 85  Temp(Src) 98.3 F (36.8 C) (Oral)  Resp 20  SpO2 99% Physical Exam  Constitutional: He is oriented to person, place, and time. He appears well-developed and well-nourished. No distress.  HENT:  Head: Normocephalic and atraumatic.  Mouth/Throat: Uvula swelling present.  Bilateral periorbital edema, facial swelling  Eyes: Conjunctivae are normal.  Neck: Neck supple. No tracheal deviation present.  Cardiovascular: Regular rhythm and normal heart sounds.  Tachycardia present.   Pulmonary/Chest: Effort normal. No respiratory distress. He has wheezes (diffuse tight).  Abdominal: Soft. He exhibits no distension.  Neurological: He is alert and oriented to person, place, and time.  Skin: Skin is warm and dry.  Psychiatric: His mood appears anxious.    ED Course  Procedures (including critical care time) Labs Review Labs Reviewed - No data to display  Imaging Review No results found. I have personally reviewed and  evaluated these images and lab results as part of my medical decision-making.   EKG Interpretation None      MDM   Final diagnoses:  Anaphylaxis, initial encounter    44 y.o. male presents with eye and facial swelling,throat fullness and wheezing after eating pancakes at a relative's house for christmas. 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 No known exposure that has triggered previously. Multiple organ system involvement with wheezes and swelling so H1/H2 blockers, steroids, epi administered. Continued to show improvement and cleared wheezing without recurrence after 3 hours of observation. Recommended scheduling medications with epi pen on person for rescue if having recurrent breathing difficulty and to return immediately for further observation.     Leo Grosser, MD 04/18/15 [phone]

## 2015-04-18 NOTE — ED Notes (Signed)
MD at bedside. 

## 2015-04-18 NOTE — Discharge Instructions (Signed)

## 2015-09-14 DIAGNOSIS — Z716 Tobacco abuse counseling: Secondary | ICD-10-CM | POA: Diagnosis not present

## 2015-09-14 DIAGNOSIS — Z6836 Body mass index (BMI) 36.0-36.9, adult: Secondary | ICD-10-CM | POA: Diagnosis not present

## 2015-10-22 DIAGNOSIS — I519 Heart disease, unspecified: Secondary | ICD-10-CM | POA: Diagnosis not present

## 2015-10-22 DIAGNOSIS — I10 Essential (primary) hypertension: Secondary | ICD-10-CM | POA: Diagnosis not present

## 2015-10-22 DIAGNOSIS — I2583 Coronary atherosclerosis due to lipid rich plaque: Secondary | ICD-10-CM | POA: Diagnosis not present

## 2015-10-22 DIAGNOSIS — I2510 Atherosclerotic heart disease of native coronary artery without angina pectoris: Secondary | ICD-10-CM | POA: Diagnosis not present

## 2015-10-22 DIAGNOSIS — F341 Dysthymic disorder: Secondary | ICD-10-CM | POA: Diagnosis not present

## 2016-05-16 DIAGNOSIS — R5383 Other fatigue: Secondary | ICD-10-CM | POA: Diagnosis not present

## 2016-05-16 DIAGNOSIS — F341 Dysthymic disorder: Secondary | ICD-10-CM | POA: Diagnosis not present

## 2016-05-16 DIAGNOSIS — E785 Hyperlipidemia, unspecified: Secondary | ICD-10-CM | POA: Diagnosis not present

## 2016-05-16 DIAGNOSIS — I10 Essential (primary) hypertension: Secondary | ICD-10-CM | POA: Diagnosis not present

## 2016-05-16 DIAGNOSIS — I2510 Atherosclerotic heart disease of native coronary artery without angina pectoris: Secondary | ICD-10-CM | POA: Diagnosis not present

## 2016-12-19 IMAGING — CR DG CHEST 1V
1 series · 1 of 1 positions shown · non-contrast
Comparison: 01/01/2015

CLINICAL DATA: Intubation.

EXAM:
CHEST  1 VIEW

[AP]
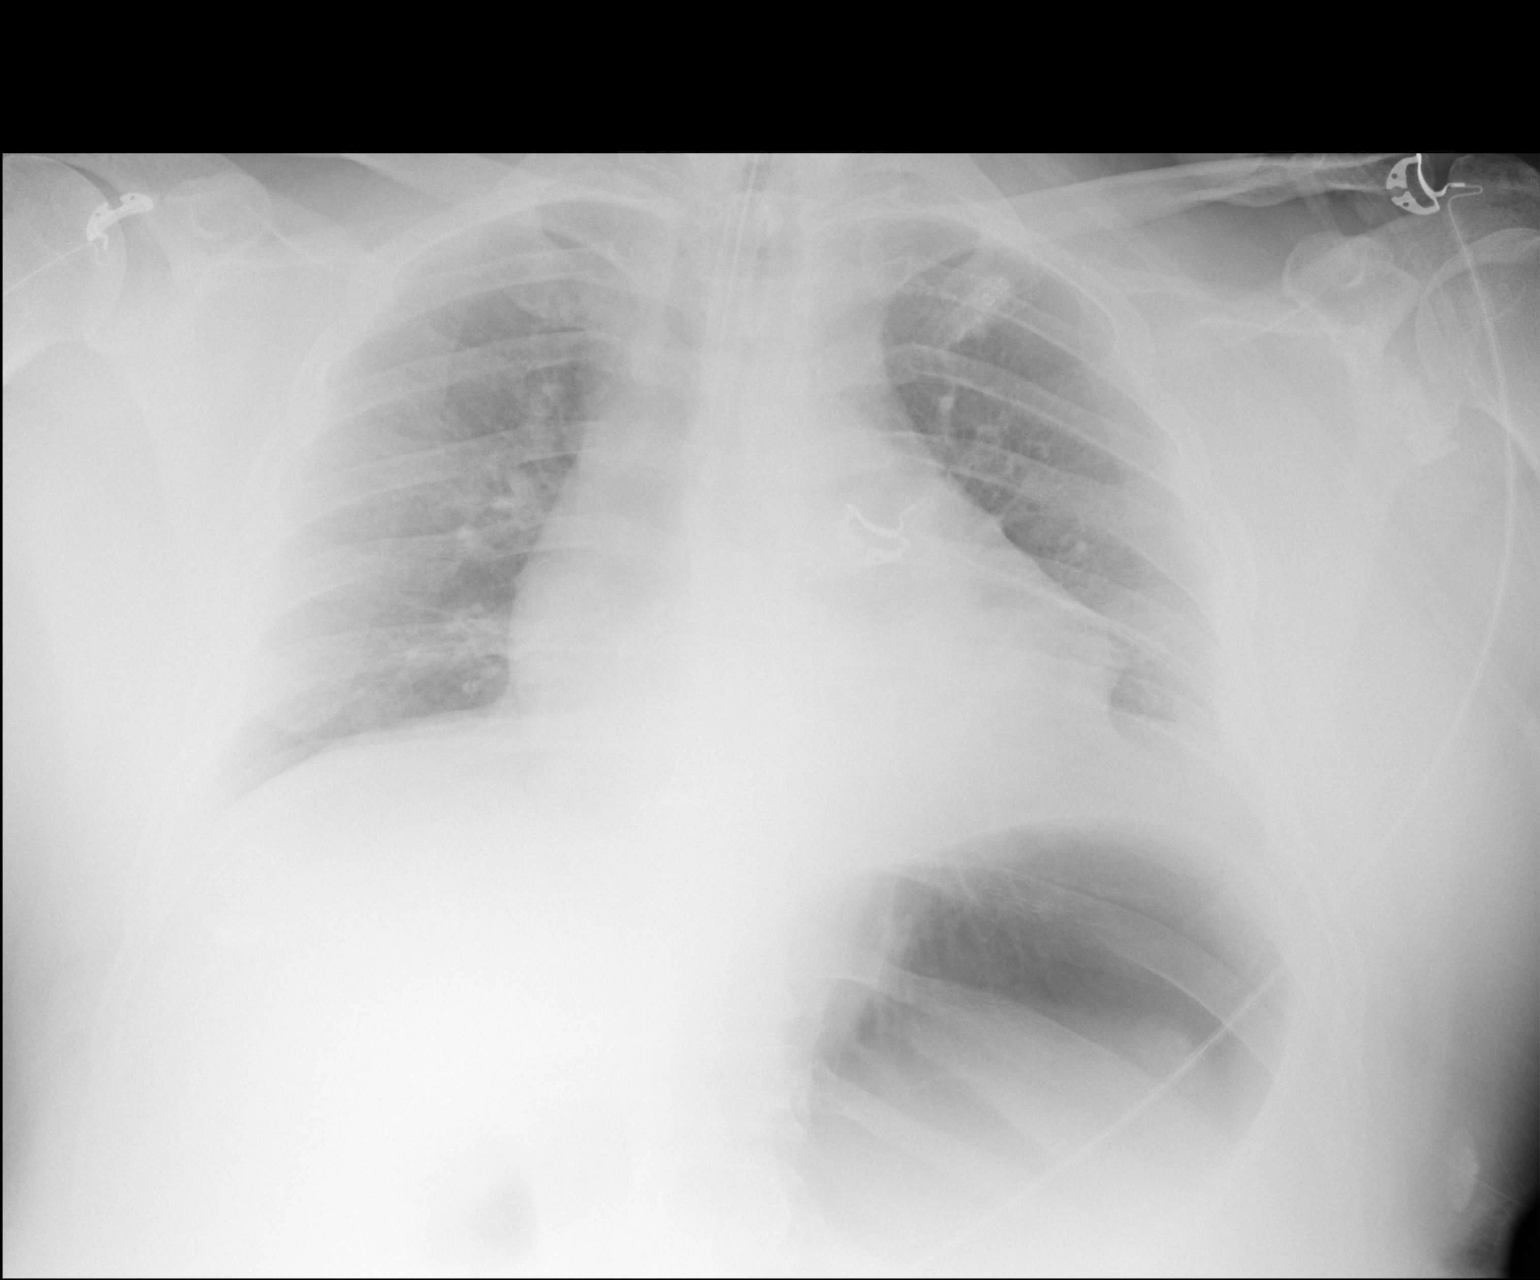

[1 of 1 positions shown; findings below may reference images not displayed]

FINDINGS: Endotracheal tube has been placed with tip approximately 1.6 cm
above the carina. Lungs are hypoinflated with minimal left base/
retrocardiac opacification. No definite effusion or pneumothorax.
Cardiomediastinal silhouette and remainder of the exam is unchanged.
IMPRESSION: Hypoinflation with minimal left base/ retrocardiac opacification
which may be due to atelectasis or infection.

Endotracheal tube with tip 1.6 cm above the carina.

## 2017-01-11 DIAGNOSIS — H524 Presbyopia: Secondary | ICD-10-CM | POA: Diagnosis not present

## 2017-01-11 DIAGNOSIS — H52223 Regular astigmatism, bilateral: Secondary | ICD-10-CM | POA: Diagnosis not present

## 2017-01-19 DIAGNOSIS — Z760 Encounter for issue of repeat prescription: Secondary | ICD-10-CM | POA: Diagnosis not present

## 2017-02-28 DIAGNOSIS — Z6838 Body mass index (BMI) 38.0-38.9, adult: Secondary | ICD-10-CM | POA: Diagnosis not present

## 2017-02-28 DIAGNOSIS — J4520 Mild intermittent asthma, uncomplicated: Secondary | ICD-10-CM | POA: Diagnosis not present

## 2017-07-12 DIAGNOSIS — Z6838 Body mass index (BMI) 38.0-38.9, adult: Secondary | ICD-10-CM | POA: Diagnosis not present

## 2017-07-12 DIAGNOSIS — J4520 Mild intermittent asthma, uncomplicated: Secondary | ICD-10-CM | POA: Diagnosis not present

## 2017-07-12 DIAGNOSIS — I10 Essential (primary) hypertension: Secondary | ICD-10-CM | POA: Diagnosis not present

## 2017-07-12 DIAGNOSIS — E782 Mixed hyperlipidemia: Secondary | ICD-10-CM | POA: Diagnosis not present

## 2017-10-09 DIAGNOSIS — J4520 Mild intermittent asthma, uncomplicated: Secondary | ICD-10-CM | POA: Diagnosis not present

## 2017-10-09 DIAGNOSIS — I2510 Atherosclerotic heart disease of native coronary artery without angina pectoris: Secondary | ICD-10-CM | POA: Diagnosis not present

## 2017-10-09 DIAGNOSIS — I10 Essential (primary) hypertension: Secondary | ICD-10-CM | POA: Diagnosis not present

## 2017-10-09 DIAGNOSIS — E782 Mixed hyperlipidemia: Secondary | ICD-10-CM | POA: Diagnosis not present

## 2018-05-10 DIAGNOSIS — I2510 Atherosclerotic heart disease of native coronary artery without angina pectoris: Secondary | ICD-10-CM | POA: Diagnosis not present

## 2018-05-10 DIAGNOSIS — E782 Mixed hyperlipidemia: Secondary | ICD-10-CM | POA: Diagnosis not present

## 2018-05-10 DIAGNOSIS — I10 Essential (primary) hypertension: Secondary | ICD-10-CM | POA: Diagnosis not present

## 2018-05-10 DIAGNOSIS — J4520 Mild intermittent asthma, uncomplicated: Secondary | ICD-10-CM | POA: Diagnosis not present

## 2018-05-29 DIAGNOSIS — J069 Acute upper respiratory infection, unspecified: Secondary | ICD-10-CM | POA: Diagnosis not present

## 2018-05-29 DIAGNOSIS — J101 Influenza due to other identified influenza virus with other respiratory manifestations: Secondary | ICD-10-CM | POA: Diagnosis not present

## 2018-05-29 DIAGNOSIS — Z6837 Body mass index (BMI) 37.0-37.9, adult: Secondary | ICD-10-CM | POA: Diagnosis not present

## 2018-10-22 DIAGNOSIS — L559 Sunburn, unspecified: Secondary | ICD-10-CM | POA: Diagnosis not present

## 2018-10-22 DIAGNOSIS — Z6836 Body mass index (BMI) 36.0-36.9, adult: Secondary | ICD-10-CM | POA: Diagnosis not present

## 2020-11-24 ENCOUNTER — Encounter: Payer: Self-pay | Admitting: Gastroenterology

## 2020-12-01 ENCOUNTER — Encounter: Payer: Self-pay | Admitting: Gastroenterology

## 2020-12-01 ENCOUNTER — Ambulatory Visit: Payer: PRIVATE HEALTH INSURANCE | Admitting: Gastroenterology

## 2020-12-01 ENCOUNTER — Other Ambulatory Visit (INDEPENDENT_AMBULATORY_CARE_PROVIDER_SITE_OTHER): Payer: PRIVATE HEALTH INSURANCE

## 2020-12-01 VITALS — BP 104/70 | HR 88 | Ht 71.25 in | Wt 264.5 lb

## 2020-12-01 DIAGNOSIS — R197 Diarrhea, unspecified: Secondary | ICD-10-CM

## 2020-12-01 LAB — SEDIMENTATION RATE: Sed Rate: 30 mm/hr — ABNORMAL HIGH (ref 0–15)

## 2020-12-01 LAB — COMPREHENSIVE METABOLIC PANEL
ALT: 22 U/L (ref 0–53)
AST: 25 U/L (ref 0–37)
Albumin: 4.1 g/dL (ref 3.5–5.2)
Alkaline Phosphatase: 76 U/L (ref 39–117)
BUN: 9 mg/dL (ref 6–23)
CO2: 24 mEq/L (ref 19–32)
Calcium: 9.4 mg/dL (ref 8.4–10.5)
Chloride: 99 mEq/L (ref 96–112)
Creatinine, Ser: 0.99 mg/dL (ref 0.40–1.50)
GFR: 89.74 mL/min (ref >60.00)
Glucose, Bld: 118 mg/dL — ABNORMAL HIGH (ref 70–99)
Potassium: 4.1 mEq/L (ref 3.5–5.1)
Sodium: 132 mEq/L — ABNORMAL LOW (ref 135–145)
Total Bilirubin: 0.4 mg/dL (ref 0.2–1.2)
Total Protein: 7.2 g/dL (ref 6.0–8.3)

## 2020-12-01 LAB — CBC WITH DIFFERENTIAL/PLATELET
Basophils Absolute: 0 10*3/uL (ref 0.0–0.1)
Basophils Relative: 0.8 % (ref 0.0–3.0)
Eosinophils Absolute: 0.1 10*3/uL (ref 0.0–0.7)
Eosinophils Relative: 1.4 % (ref 0.0–5.0)
HCT: 44.5 % (ref 39.0–52.0)
Hemoglobin: 14.8 g/dL (ref 13.0–17.0)
Lymphocytes Relative: 24.3 % (ref 12.0–46.0)
Lymphs Abs: 1.4 10*3/uL (ref 0.7–4.0)
MCHC: 33.2 g/dL (ref 30.0–36.0)
MCV: 89.5 fl (ref 78.0–100.0)
Monocytes Absolute: 0.7 10*3/uL (ref 0.1–1.0)
Monocytes Relative: 11.7 % (ref 3.0–12.0)
Neutro Abs: 3.6 10*3/uL (ref 1.4–7.7)
Neutrophils Relative %: 61.8 % (ref 43.0–77.0)
Platelets: 259 10*3/uL (ref 150.0–400.0)
RBC: 4.97 Mil/uL (ref 4.22–5.81)
RDW: 13.9 % (ref 11.5–15.5)
WBC: 5.8 10*3/uL (ref 4.0–10.5)

## 2020-12-01 LAB — C-REACTIVE PROTEIN: CRP: <1 mg/dL (ref 0.5–20.0)

## 2020-12-01 LAB — TSH: TSH: 0.69 u[IU]/mL (ref 0.35–5.50)

## 2020-12-01 MED ORDER — PEG-KCL-NACL-NASULF-NA ASC-C 100 G PO SOLR: 1.0000 | Freq: Once | ORAL | 0 refills | Status: AC

## 2020-12-01 NOTE — Progress Notes (Signed)
History of Present Illness: This is a 50 year old male referred by Angel Pickler, DO for the evaluation of diarrhea alternating with constipation.  He relates of frequent diarrhea for approximately 1 year.  Diarrhea often follows meals.  Occasionally he will go 2 to 3 days without a bowel movement.  He notes increased intestinal gas occasional nausea and sour belches.  He relates a 40 pound weight loss since completely discontinuing alcohol use 10 months ago.  He notes that nuts and fresh fruits exacerbate his diarrhea.  He notes infrequent episodes of hemorrhoidal swelling and scant rectal bleeding.  Reflux symptoms are well controlled.  Denies weight loss, abdominal pain, change in stool caliber, melena, nausea, vomiting, dysphagia, chest pain.    Allergies  Allergen Reactions   Dust Mite Extract Anaphylaxis and Rash   Eggs Or Egg-Derived Products Anaphylaxis   Influenza Vaccines     ALLERGY TO EGGS   Metoprolol Shortness Of Breath    Mild increase in SOB related to asthma   Vicodin [Hydrocodone-Acetaminophen] Nausea And Vomiting   Outpatient Medications Prior to Visit  Medication Sig Dispense Refill   albuterol (PROVENTIL HFA;VENTOLIN HFA) 108 (90 BASE) MCG/ACT inhaler Inhale 2 puffs into the lungs every 4 (four) hours as needed for wheezing or shortness of breath.     amphetamine-dextroamphetamine (ADDERALL XR) 30 MG 24 hr capsule Take 30 mg by mouth every morning.     atorvastatin (LIPITOR) 40 MG tablet Take 1 tablet by mouth daily.  1   bisoprolol (ZEBETA) 5 MG tablet Take 0.5 tablets (2.5 mg total) by mouth daily. 30 tablet 6   DULoxetine (CYMBALTA) 30 MG capsule Take 30 mg by mouth daily.     fluticasone (FLONASE) 50 MCG/ACT nasal spray Place 1 spray into both nostrils as needed.     isosorbide mononitrate (IMDUR) 30 MG 24 hr tablet Take 0.5 tablets (15 mg total) by mouth daily. 45 tablet 3   Multiple Vitamins-Minerals (MULTIVITAMIN WITH MINERALS) tablet Take 1 tablet by mouth  daily.     pantoprazole (PROTONIX) 40 MG tablet Take 40 mg by mouth daily.     SYMBICORT 80-4.5 MCG/ACT inhaler Inhale 1 puff into the lungs daily.     valsartan (DIOVAN) 160 MG tablet Take 1 tablet (160 mg total) by mouth daily. 30 tablet 11   ZYRTEC ALLERGY 10 MG CAPS Take 1 tablet by mouth daily.     EPINEPHrine 0.3 mg/0.3 mL IJ SOAJ injection Inject 0.3 mLs (0.3 mg total) into the muscle once. (Patient not taking: Reported on 12/01/2020) 1 Device 0   nitroGLYCERIN (NITROSTAT) 0.4 MG SL tablet Place 0.4 mg under the tongue every 5 (five) minutes as needed for chest pain. (Patient not taking: Reported on 12/01/2020)     diphenhydrAMINE (BENADRYL) 25 MG tablet Take 2 tablets (50 mg total) by mouth every 6 (six) hours. 40 tablet 0   predniSONE (DELTASONE) 10 MG tablet Take 5 tablets (50 mg total) by mouth daily. 20 tablet 0   ranitidine (ZANTAC) 150 MG tablet Take 1 tablet (150 mg total) by mouth 2 (two) times daily. 10 tablet 0   No facility-administered medications prior to visit.   Past Medical History:  Diagnosis Date   Alcoholism ([place])    Anxiety    Asthma    Attention and concentration deficit    Coronary artery disease    a.  s/p BMS to LCx in (2012); DES to RCA on 01/27/14   Depression    GERD (  gastroesophageal reflux disease)    Hyperlipidemia    Hypertension    Myocardial infarction ([place]) 10/12/2010   Past Surgical History:  Procedure Laterality Date   CARDIAC CATHETERIZATION  01/04/2015   Patent stents in the RCA and CFX, LAD 35%, preserved LV function   CARDIAC CATHETERIZATION N/A 01/04/2015   Procedure: Left Heart Cath and Coronary Angiography;  Surgeon: Lorretta Harp, MD;  Location: [facility];  Service: Cardiovascular;  Laterality: N/A;   CORONARY STENT PLACEMENT  09/23/2010   BMS to the CFX   FASCIOTOMY Right 01/04/2015   Procedure: Left Forearm Fasciotomy;  Surgeon: [name], MD;  Location: [place];  Service: Vascular;  Laterality: Right;    FASCIOTOMY CLOSURE Right 01/06/2015   Procedure: RIGHT ARM FASCIOTOMY CLOSURE;  Surgeon: [name], MD;  Location: [place] [place];  Service: Vascular;  Laterality: Right;   LEFT HEART CATHETERIZATION WITH CORONARY ANGIOGRAM N/A 01/27/2014   Procedure: LEFT HEART CATHETERIZATION WITH CORONARY ANGIOGRAM;  Surgeon: Peter M Martinique, MD;  Location: [facility] C[facility];  Service: Cardiovascular;  Laterality: N/A;   LEFT HEART CATHETERIZATION WITH CORONARY ANGIOGRAM N/A 01/30/2014   Procedure: LEFT HEART CATHETERIZATION WITH CORONARY ANGIOGRAM;  Surgeon: Jettie Booze, MD;  Location: [facility];  Service: Cardiovascular;  Laterality: N/A;   PERCUTANEOUS CORONARY STENT INTERVENTION (PCI-S)  01/27/2014   Procedure: PERCUTANEOUS CORONARY STENT INTERVENTION (PCI-S);  Surgeon: Peter M Martinique, MD;  Location: [facility] C[facility];  Service: Cardiovascular;;   REPLANTATION THUMB Left    WISDOM TOOTH EXTRACTION     Social History   Socioeconomic History   Marital status: Married    Spouse name: Not on file   Number of children: 3   Years of education: Not on file   Highest education level: Not on file  Occupational History   Occupation: Chief Financial Officer  Tobacco Use   Smoking status: Former    Packs/day: 1.00    Years: 22.00    Pack years: 22.00    Types: Cigarettes    Quit date: 01/28/2011    Years since quitting: 9.8   Smokeless tobacco: Never   Tobacco comments:    1/2 - 1 PPD  Vaping Use   Vaping Use: Some days  Substance and Sexual Activity   Alcohol use: Not Currently    Comment: QUIT drinking 01/2014   Drug use: No   Sexual activity: Not on file  Other Topics Concern   Not on file  Social History Narrative   Not on file   Social Determinants of Health   Financial Resource Strain: Not on file  Food Insecurity: Not on file  Transportation Needs: Not on file  Physical Activity: Not on file  Stress: Not on file  Social Connections: Not on file   Family History  Problem Relation Age of Onset    Hypertension Mother    Diverticulitis Mother    Pancreatic cancer Father        pancreatic   Heart attack Father    Heart disease Father        CAD   Liver cancer Father    Heart disease Maternal Grandmother    Heart disease Paternal Grandmother    Heart attack Paternal Grandfather    Heart disease Paternal Grandfather        CAD   Autism Son      Review of Systems: Pertinent positive and negative review of systems were noted in the above HPI section. All other review of systems were  otherwise negative.   Physical Exam: General: Well developed, well nourished, no acute distress Head: Normocephalic and atraumatic Eyes: Sclerae anicteric, EOMI Ears: Normal auditory acuity Mouth: Not examined, mask on during Covid-19 pandemic Neck: Supple, no masses or thyromegaly Lungs: Clear throughout to auscultation Heart: Regular rate and rhythm; no murmurs, rubs or bruits Abdomen: Soft, non tender and non distended. No masses, hepatosplenomegaly or hernias noted. Normal Bowel sounds Rectal: Deferred to colonoscopy  Musculoskeletal: Symmetrical with no gross deformities  Skin: No lesions on visible extremities Pulses:  Normal pulses noted Extremities: No clubbing, cyanosis, edema or deformities noted Neurological: Alert oriented x 4, grossly nonfocal Cervical Nodes:  No significant cervical adenopathy Inguinal Nodes: No significant inguinal adenopathy Psychological:  Alert and cooperative. Normal mood and affect   Assessment and Recommendations:  Frequent, urgent postprandial diarrhea, occasional constipation, weight loss, gas.  CMP, CBC, TSH, ESR, CRP, TTG, IgA and stool GI profile.  Continue to avoid nuts, raw fruits and raw vegetables.  Avoid any other foods and beverages that exacerbate symptoms.  Her schedule colonoscopy. The risks (including bleeding, perforation, infection, missed lesions, medication reactions and possible hospitalization or surgery if complications occur),  benefits, and alternatives to colonoscopy with possible biopsy and possible polypectomy were discussed with the patient and they consent to proceed.     cc: Angel Pickler, DO [address] [place],  [place] [address]

## 2020-12-01 NOTE — Patient Instructions (Signed)
Your provider has requested that you go to the basement level for lab work before leaving today. Press "B" on the elevator. The lab is located at the first door on the left as you exit the elevator.  You have been scheduled for a colonoscopy. Please follow written instructions given to you at your visit today.  Please pick up your prep supplies at the pharmacy within the next 1-3 days. If you use inhalers (even only as needed), please bring them with you on the day of your procedure.  Due to recent changes in healthcare laws, you may see the results of your imaging and laboratory studies on MyChart before your provider has had a chance to review them.  We understand that in some cases there may be results that are confusing or concerning to you. Not all laboratory results come back in the same time frame and the provider may be waiting for multiple results in order to interpret others.  Please give [place] 48 hours in order for your provider to thoroughly review all the results before contacting the office for clarification of your results.   The [facility] providers would like to encourage you to use [facility] to communicate with providers for non-urgent requests or questions.  Due to long hold times on the telephone, sending your provider a message by [facility] may be a faster and more efficient way to get a response.  Please allow 48 business hours for a response.  Please remember that this is for non-urgent requests.   Thank you for choosing me and [facility].  Pricilla Riffle. Dagoberto Ligas., MD., Marval Regal

## 2020-12-02 LAB — IGA: Immunoglobulin A: 183 mg/dL (ref 47–310)

## 2020-12-02 LAB — TISSUE TRANSGLUTAMINASE, IGA: (tTG) Ab, IgA: <1 U/mL

## 2020-12-03 ENCOUNTER — Other Ambulatory Visit: Payer: Self-pay | Admitting: Physician Assistant

## 2020-12-03 ENCOUNTER — Other Ambulatory Visit: Payer: PRIVATE HEALTH INSURANCE

## 2020-12-03 DIAGNOSIS — R197 Diarrhea, unspecified: Secondary | ICD-10-CM

## 2020-12-06 LAB — GI PROFILE, STOOL, PCR

## 2020-12-07 ENCOUNTER — Telehealth: Payer: Self-pay

## 2020-12-07 NOTE — Telephone Encounter (Signed)
-----   Message from Ladene Artist, MD sent at 12/06/2020  4:33 PM EDT ----- Regarding: FW: Negative ----- Message ----- From: Lavone Neri Lab Results In Sent: 12/06/2020   1:36 PM EDT To: Ladene Artist, MD

## 2020-12-07 NOTE — Telephone Encounter (Signed)
Left message to please call back. °

## 2020-12-31 ENCOUNTER — Ambulatory Visit (AMBULATORY_SURGERY_CENTER): Payer: PRIVATE HEALTH INSURANCE | Admitting: Gastroenterology

## 2020-12-31 ENCOUNTER — Encounter: Payer: Self-pay | Admitting: Gastroenterology

## 2020-12-31 ENCOUNTER — Other Ambulatory Visit: Payer: Self-pay

## 2020-12-31 VITALS — BP 118/79 | HR 61 | Temp 97.6°F | Resp 19 | Ht 71.0 in | Wt 264.0 lb

## 2020-12-31 DIAGNOSIS — K5732 Diverticulitis of large intestine without perforation or abscess without bleeding: Secondary | ICD-10-CM | POA: Diagnosis not present

## 2020-12-31 DIAGNOSIS — D120 Benign neoplasm of cecum: Secondary | ICD-10-CM

## 2020-12-31 DIAGNOSIS — K6289 Other specified diseases of anus and rectum: Secondary | ICD-10-CM | POA: Diagnosis not present

## 2020-12-31 DIAGNOSIS — R197 Diarrhea, unspecified: Secondary | ICD-10-CM

## 2020-12-31 DIAGNOSIS — K635 Polyp of colon: Secondary | ICD-10-CM

## 2020-12-31 DIAGNOSIS — D123 Benign neoplasm of transverse colon: Secondary | ICD-10-CM

## 2020-12-31 DIAGNOSIS — R634 Abnormal weight loss: Secondary | ICD-10-CM

## 2020-12-31 MED ORDER — SODIUM CHLORIDE 0.9 % IV SOLN: 500.0000 mL | Freq: Once | INTRAVENOUS | Status: DC

## 2020-12-31 NOTE — Progress Notes (Signed)
Check-in-aer  V/s-cw

## 2020-12-31 NOTE — Op Note (Addendum)
[facility] Patient Name: Angel Gutierrez Procedure Date: 12/31/2020 8:28 AM MRN: [id number] Endoscopist: Ladene Artist , MD Age: 50 Referring MD:  Date of Birth: 04/06/1971 Gender: Male Account #: [id number] Procedure:                Colonoscopy Indications:              Clinically significant diarrhea of unexplained                            origin, Weight loss Medicines:                Monitored Anesthesia Care Procedure:                Pre-Anesthesia Assessment:                           - Prior to the procedure, a History and Physical                            was performed, and patient medications and                            allergies were reviewed. The patient's tolerance of                            previous anesthesia was also reviewed. The risks                            and benefits of the procedure and the sedation                            options and risks were discussed with the patient.                            All questions were answered, and informed consent                            was obtained. Prior Anticoagulants: The patient has                            taken no previous anticoagulant or antiplatelet                            agents. ASA Grade Assessment: II - A patient with                            mild systemic disease. After reviewing the risks                            and benefits, the patient was deemed in                            satisfactory condition to undergo the procedure.  After obtaining informed consent, the colonoscope                            was passed under direct vision. Throughout the                            procedure, the patient's blood pressure, pulse, and                            oxygen saturations were monitored continuously. The                            CF [id number] was introduced through the anus                            and advanced to the the terminal ileum, with                             identification of the appendiceal orifice and IC                            valve. The terminal ileum, ileocecal valve,                            appendiceal orifice, and rectum were photographed.                            The quality of the bowel preparation was adequate                            after extensive lavage, suction. The colonoscopy                            was performed without difficulty. The patient                            tolerated the procedure well. Scope In: 8:42:47 AM Scope Out: 9:04:12 AM Scope Withdrawal Time: 0 hours 19 minutes 8 seconds  Total Procedure Duration: 0 hours 21 minutes 25 seconds  Findings:                 The perianal and digital rectal examinations were                            normal.                           The terminal ileum appeared normal.                           Three sessile polyps were found in the transverse                            colon and cecum. The polyps were 3 to 6 mm in size.  These polyps were removed with a cold snare.                            Resection and retrieval were complete.                           Multiple small-mouthed diverticula were found in                            the left colon. There was no evidence of                            diverticular bleeding.                           Anal papilla(e) were hypertrophied. Biopsies were                            taken with a cold forceps for histology.                           The exam was otherwise without abnormality on                            direct and retroflexion views. Random biospies                            obtained throughout. Complications:            No immediate complications. Estimated blood loss:                            None. Estimated Blood Loss:     Estimated blood loss: none. Impression:               - The examined portion of the ileum was normal.                            - Three 3 to 6 mm polyps in the transverse colon                            and in the cecum, removed with a cold snare.                            Resected and retrieved.                           - Mild diverticulosis in the left colon.                           - Anal papilla(e) were hypertrophied. Biopsied.                           - The examination was otherwise normal on direct  and retroflexion views. Biopsied. Recommendation:           - Repeat colonoscopy after studies are complete for                            surveillance based on pathology results.                           - Patient has a contact number available for                            emergencies. The signs and symptoms of potential                            delayed complications were discussed with the                            patient. Return to normal activities tomorrow.                            Written discharge instructions were provided to the                            patient.                           - High fiber diet.                           - Continue present medications.                           - Imodium 1-2 po bid prn.                           - Await pathology results. Ladene Artist, MD 12/31/2020 9:12:05 AM This report has been signed electronically.

## 2020-12-31 NOTE — Progress Notes (Signed)
See 12/01/2020 H&P, no changes. This patient is appropriate for endoscopic procedures in the ambulatory setting.

## 2020-12-31 NOTE — Patient Instructions (Addendum)
Handouts were given to your care partner on polyps, diverticulosis, and a high fiber diet with liberal fluid intake. You may resume your current medications today. Per Dr. Fuller Plan you may use IMODIUM for diarrhea 1-2 by mouth 2 x as needed. Await biopsy results.  May take 1-3 weeks to receive pathology results. Please call if any questions or concerns.     YOU HAD AN ENDOSCOPIC PROCEDURE TODAY AT [facility]:   Refer to the procedure report that was given to you for any specific questions about what was found during the examination.  If the procedure report does not answer your questions, please call your gastroenterologist to clarify.  If you requested that your care partner not be given the details of your procedure findings, then the procedure report has been included in a sealed envelope for you to review at your convenience later.  YOU SHOULD EXPECT: Some feelings of bloating in the abdomen. Passage of more gas than usual.  Walking can help get rid of the air that was put into your GI tract during the procedure and reduce the bloating. If you had a lower endoscopy (such as a colonoscopy or flexible sigmoidoscopy) you may notice spotting of blood in your stool or on the toilet paper. If you underwent a bowel prep for your procedure, you may not have a normal bowel movement for a few days.  Please Note:  You might notice some irritation and congestion in your nose or some drainage.  This is from the oxygen used during your procedure.  There is no need for concern and it should clear up in a day or so.  SYMPTOMS TO REPORT IMMEDIATELY:  Following lower endoscopy (colonoscopy or flexible sigmoidoscopy):  Excessive amounts of blood in the stool  Significant tenderness or worsening of abdominal pains  Swelling of the abdomen that is new, acute  Fever of 100F or higher   For urgent or emergent issues, a gastroenterologist can be reached at any hour by calling [phone]. Do  not use MyChart messaging for urgent concerns.    DIET:  We do recommend a small meal at first, but then you may proceed to your regular diet.  Drink plenty of fluids but you should avoid alcoholic beverages for 24 hours.  ACTIVITY:  You should plan to take it easy for the rest of today and you should NOT DRIVE or use heavy machinery until tomorrow (because of the sedation medicines used during the test).    FOLLOW UP: Our staff will call the number listed on your records 48-72 hours following your procedure to check on you and address any questions or concerns that you may have regarding the information given to you following your procedure. If we do not reach you, we will leave a message.  We will attempt to reach you two times.  During this call, we will ask if you have developed any symptoms of COVID 19. If you develop any symptoms (ie: fever, flu-like symptoms, shortness of breath, cough etc.) before then, please call [phone].  If you test positive for Covid 19 in the 2 weeks post procedure, please call and report this information to [place].    If any biopsies were taken you will be contacted by phone or by letter within the next 1-3 weeks.  Please call us at [phone] if you have not heard about the biopsies in 3 weeks.    SIGNATURES/CONFIDENTIALITY: You and/or your care partner have signed paperwork which will  be entered into your electronic medical record.  These signatures attest to the fact that that the information above on your After Visit Summary has been reviewed and is understood.  Full responsibility of the confidentiality of this discharge information lies with you and/or your care-partner.

## 2020-12-31 NOTE — Progress Notes (Signed)
Per Gaye Pollack, CRNA pt has undiagnosed sleep apnea.  Pt need to be evaluated for sleep apnea.  Pt and his wife were told to speak with his primary care MD about sleep study.   No problems noted in the recovery room. maw

## 2020-12-31 NOTE — Progress Notes (Signed)
Called to room to assist during endoscopic procedure.  Patient ID and intended procedure confirmed with present staff. Received instructions for my participation in the procedure from the performing physician.  

## 2020-12-31 NOTE — Progress Notes (Signed)
A and O x3. Report to RN. Tolerated MAC anesthesia well. 

## 2021-01-04 ENCOUNTER — Telehealth: Payer: Self-pay

## 2021-01-04 NOTE — Telephone Encounter (Signed)
  Follow up Call-  Call back number 12/31/2020  Post procedure Call Back phone  # [phone]  Permission to leave phone message Yes  Some recent data might be hidden     Left message

## 2021-01-04 NOTE — Telephone Encounter (Signed)
  Follow up Call-  Call back number 12/31/2020  Post procedure Call Back phone  # [phone]  Permission to leave phone message Yes  Some recent data might be hidden     Patient questions:  Do you have a fever, pain , or abdominal swelling? No. Pain Score  0 *  Have you tolerated food without any problems? Yes.    Have you been able to return to your normal activities? Yes.    Do you have any questions about your discharge instructions: Diet   No. Medications  No. Follow up visit  No.  Do you have questions or concerns about your Care? No.  Actions: * If pain score is 4 or above: No action needed, pain <4.  Have you developed a fever since your procedure? no  2.   Have you had an respiratory symptoms (SOB or cough) since your procedure? no  3.   Have you tested positive for COVID 19 since your procedure no  4.   Have you had any family members/close contacts diagnosed with the COVID 19 since your procedure?  no   If yes to any of these questions please route to Joylene John, RN and Joella Prince, RN

## 2021-01-17 ENCOUNTER — Encounter: Payer: Self-pay | Admitting: Gastroenterology

## 2021-02-11 ENCOUNTER — Ambulatory Visit: Payer: PRIVATE HEALTH INSURANCE | Admitting: Cardiovascular Disease

## 2021-03-23 ENCOUNTER — Ambulatory Visit (INDEPENDENT_AMBULATORY_CARE_PROVIDER_SITE_OTHER): Payer: PRIVATE HEALTH INSURANCE | Admitting: Cardiovascular Disease

## 2021-03-23 ENCOUNTER — Other Ambulatory Visit: Payer: Self-pay

## 2021-03-23 ENCOUNTER — Encounter: Payer: Self-pay | Admitting: Cardiovascular Disease

## 2021-03-23 VITALS — BP 116/72 | HR 72 | Ht 72.0 in | Wt 259.6 lb

## 2021-03-23 DIAGNOSIS — E785 Hyperlipidemia, unspecified: Secondary | ICD-10-CM | POA: Diagnosis not present

## 2021-03-23 DIAGNOSIS — Z955 Presence of coronary angioplasty implant and graft: Secondary | ICD-10-CM | POA: Diagnosis not present

## 2021-03-23 DIAGNOSIS — I10 Essential (primary) hypertension: Secondary | ICD-10-CM

## 2021-03-23 DIAGNOSIS — R079 Chest pain, unspecified: Secondary | ICD-10-CM | POA: Diagnosis not present

## 2021-03-23 DIAGNOSIS — R0609 Other forms of dyspnea: Secondary | ICD-10-CM

## 2021-03-23 NOTE — Patient Instructions (Signed)
Medication Instructions:  Your physician recommends that you continue on your current medications as directed. Please refer to the Current Medication list given to you today.  *If you need a refill on your cardiac medications before your next appointment, please call your pharmacy*   Lab Work: Your physician recommends that you have labs drawn today: lipid/liver profile  If you have labs (blood work) drawn today and your tests are completely normal, you will receive your results only by: [place] (if you have MyChart) OR A paper copy in the mail If you have any lab test that is abnormal or we need to change your treatment, we will call you to review the results.   Testing/Procedures: Your physician has requested that you have an echocardiogram. Echocardiography is a painless test that uses sound waves to create images of your heart. It provides your doctor with information about the size and shape of your heart and how well your heart's chambers and valves are working. This procedure takes approximately one hour. There are no restrictions for this procedure. This procedure is done at [address]  Dr. Gwenlyn Found has ordered a Myocardial Perfusion Imaging Study.   The test will take approximately 3 to 4 hours to complete; you may bring reading material.  If someone comes with you to your appointment, they will need to remain in the main lobby due to limited space in the testing area.  You will need to hold the following medications prior to your stress test: beta-blockers (24 hours prior to test)   How to prepare for your Myocardial Perfusion Test: Do not eat or drink 3 hours prior to your test, except you may have water. Do not consume products containing caffeine (regular or decaffeinated) 12 hours prior to your test. (ex: coffee, chocolate, sodas, tea). Do wear comfortable clothes (no dresses or overalls) and walking shoes, tennis shoes preferred (No heels or open toe shoes are  allowed). Do NOT wear cologne, perfume, aftershave, or lotions (deodorant is allowed). If these instructions are not followed, your test will have to be rescheduled.    Follow-Up: At [facility], you and your health needs are our priority.  As part of our continuing mission to provide you with exceptional heart care, we have created designated Provider Care Teams.  These Care Teams include your primary Cardiologist (physician) and Advanced Practice Providers (APPs -  Physician Assistants and Nurse Practitioners) who all work together to provide you with the care you need, when you need it.  We recommend signing up for the patient portal called "MyChart".  Sign up information is provided on this After Visit Summary.  MyChart is used to connect with patients for Virtual Visits (Telemedicine).  Patients are able to view lab/test results, encounter notes, upcoming appointments, etc.  Non-urgent messages can be sent to your provider as well.   To learn more about what you can do with MyChart, go to [email or web address].    Your next appointment:   4 week(s)  The format for your next appointment:   In Person  Provider:  Quay Burow, MD

## 2021-03-23 NOTE — Assessment & Plan Note (Signed)
History of essential hypertension blood pressure measured today 116/72.  He is on valsartan, and Zebeta.

## 2021-03-23 NOTE — Assessment & Plan Note (Addendum)
History of CAD status post RCA stenting by myself in 2012 with AV groove circumflex stenting by Dr. Martinique several years after.  His last catheterization performed by myself 01/04/2015 revealed patent stents with normal LV function.  Unfortunately, he suffered a right forearm compartment syndrome requiring decompression by Dr. Oneida Alar.  He denies chest pain but does complain of dyspnea on exertion which is new for him.  I am going to obtain a 2D echocardiogram and exercise Myoview stress test to further evaluate.

## 2021-03-23 NOTE — Assessment & Plan Note (Signed)
History of hyperlipidemia currently on rosuvastatin 20 mg a day.  His last lipid profile performed by his PCP 11/13/2020 when he was not on statin therapy revealed total cholesterol 258, LDL of 197 and HDL of 52.  We will recheck a fasting lipid liver profile today.

## 2021-03-23 NOTE — Progress Notes (Signed)
03/23/2021 Collene Schlichter   [birth year]  [id number]  Primary Physician Rosalee Kaufman, PA-C Primary Cardiologist: Lorretta Harp MD FACP, [facility], [place], [place]  HPI:  Angel Gutierrez is a 50 y.o.  moderately overweight married Caucasian male father of 3 children who I last saw in the office 02/23/2015.  He has a history of CAD status post bare metal stent to his mid RCA in 2012 by myself, drug-eluting stent to the AV groove circumflex by Dr. Martinique last year with a follow-up cath several days later revealing all stents to be widely patent. He has a history of hypertension and hyperlipidemia. He was admitted in September 2016 with unstable angina and ruled out for myocardial infarction. He underwent diagnostic coronary arteriography by myself radially 01/04/15 revealing widely patent stents without significant CAD and normal LV function. Unfortunately, he developed a right forearm compartment syndrome and needed to be decompressed by Dr. Oneida Alar. The wound was then closed 2 days later. He was seen in follow-up several weeks ago and had markedly improved.   Since I saw him 6 years ago he has lost a considerable amount of weight.  He stopped smoking 5 years ago and drinking as well.  He is starting to exercise more.  He works as a Conservator, museum/gallery.  He says that while exercising he notices that his heart pounds and he is had more dyspnea than usual.  Current Meds  Medication Sig   albuterol (PROVENTIL HFA;VENTOLIN HFA) 108 (90 BASE) MCG/ACT inhaler Inhale 2 puffs into the lungs every 4 (four) hours as needed for wheezing or shortness of breath.   amphetamine-dextroamphetamine (ADDERALL XR) 30 MG 24 hr capsule Take 30 mg by mouth every morning.   bisoprolol (ZEBETA) 5 MG tablet Take 0.5 tablets (2.5 mg total) by mouth daily.   DULoxetine (CYMBALTA) 30 MG capsule Take 30 mg by mouth daily.   isosorbide mononitrate (IMDUR) 30 MG 24 hr tablet Take 0.5 tablets (15 mg total) by mouth daily.   montelukast  (SINGULAIR) 10 MG tablet Take 10 mg by mouth daily in the afternoon.   Multiple Vitamins-Minerals (MULTIVITAMIN WITH MINERALS) tablet Take 1 tablet by mouth daily.   nitroGLYCERIN (NITROSTAT) 0.4 MG SL tablet Place 0.4 mg under the tongue every 5 (five) minutes as needed for chest pain.   pantoprazole (PROTONIX) 40 MG tablet Take 40 mg by mouth daily.   rosuvastatin (CRESTOR) 20 MG tablet Take 1 tablet by mouth every evening.   SYMBICORT 80-4.5 MCG/ACT inhaler Inhale 1 puff into the lungs daily.     Allergies  Allergen Reactions   Dust Mite Extract Anaphylaxis and Rash   Eggs Or Egg-Derived Products Anaphylaxis   Influenza Vaccines     ALLERGY TO EGGS   Metoprolol Shortness Of Breath    Mild increase in SOB related to asthma   Vicodin [Hydrocodone-Acetaminophen] Nausea And Vomiting    Social History   Socioeconomic History   Marital status: Married    Spouse name: Not on file   Number of children: 3   Years of education: Not on file   Highest education level: Not on file  Occupational History   Occupation: Chief Financial Officer  Tobacco Use   Smoking status: Former    Packs/day: 1.00    Years: 22.00    Pack years: 22.00    Types: Cigarettes    Quit date: 01/28/2011    Years since quitting: 10.1   Smokeless tobacco: Never   Tobacco comments:    1/2 -  1 PPD  Vaping Use   Vaping Use: Some days  Substance and Sexual Activity   Alcohol use: Not Currently    Comment: QUIT drinking 01/2014   Drug use: No   Sexual activity: Not on file  Other Topics Concern   Not on file  Social History Narrative   Not on file   Social Determinants of Health   Financial Resource Strain: Not on file  Food Insecurity: Not on file  Transportation Needs: Not on file  Physical Activity: Not on file  Stress: Not on file  Social Connections: Not on file  Intimate Partner Violence: Not on file     Review of Systems: General: negative for chills, fever, night sweats or weight changes.   Cardiovascular: negative for chest pain, dyspnea on exertion, edema, orthopnea, palpitations, paroxysmal nocturnal dyspnea or shortness of breath Dermatological: negative for rash Respiratory: negative for cough or wheezing Urologic: negative for hematuria Abdominal: negative for nausea, vomiting, diarrhea, bright red blood per rectum, melena, or hematemesis Neurologic: negative for visual changes, syncope, or dizziness All other systems reviewed and are otherwise negative except as noted above.    Blood pressure 116/72, pulse 72, height 6' (1.829 m), weight 259 lb 9.6 oz (117.8 kg), SpO2 98 %.  General appearance: alert and no distress Neck: no adenopathy, no carotid bruit, no JVD, supple, symmetrical, trachea midline, and thyroid not enlarged, symmetric, no tenderness/mass/nodules Lungs: clear to auscultation bilaterally Heart: regular rate and rhythm, S1, S2 normal, no murmur, click, rub or gallop Extremities: extremities normal, atraumatic, no cyanosis or edema Pulses: 2+ and symmetric Skin: Skin color, texture, turgor normal. No rashes or lesions Neurologic: Grossly normal  EKG sinus rhythm at 72 without ST or T wave changes.  Personally reviewed this EKG.  ASSESSMENT AND PLAN:   Essential hypertension History of essential hypertension blood pressure measured today 116/72.  He is on valsartan, and Zebeta.  Hyperlipidemia History of hyperlipidemia currently on rosuvastatin 20 mg a day.  His last lipid profile performed by his PCP 11/13/2020 when he was not on statin therapy revealed total cholesterol 258, LDL of 197 and HDL of 52.  We will recheck a fasting lipid liver profile today.  S/P coronary artery stent placement History of CAD status post RCA stenting by myself in 2012 with AV groove circumflex stenting by Dr. Martinique several years after.  His last catheterization performed by myself 01/04/2015 revealed patent stents with normal LV function.  Unfortunately, he suffered a  right forearm compartment syndrome requiring decompression by Dr. Oneida Alar.  He denies chest pain but does complain of dyspnea on exertion which is new for him.  I am going to obtain a 2D echocardiogram and exercise Myoview stress test to further evaluate.     Lorretta Harp MD FACP,FACC,FAHA, [facility] 03/23/2021 3:45 PM

## 2021-03-24 LAB — LIPID PANEL
Chol/HDL Ratio: 4.2 ratio (ref 0.0–5.0)
Cholesterol, Total: 193 mg/dL (ref 100–199)
HDL: 46 mg/dL
LDL Chol Calc (NIH): 132 mg/dL — ABNORMAL HIGH (ref 0–99)
Triglycerides: 82 mg/dL (ref 0–149)
VLDL Cholesterol Cal: 15 mg/dL (ref 5–40)

## 2021-03-24 LAB — HEPATIC FUNCTION PANEL
ALT: 24 IU/L (ref 0–44)
AST: 23 IU/L (ref 0–40)
Albumin: 4.4 g/dL (ref 4.0–5.0)
Alkaline Phosphatase: 101 IU/L (ref 44–121)
Bilirubin Total: 0.4 mg/dL (ref 0.0–1.2)
Bilirubin, Direct: 0.11 mg/dL (ref 0.00–0.40)
Total Protein: 6.9 g/dL (ref 6.0–8.5)

## 2021-03-28 ENCOUNTER — Other Ambulatory Visit (HOSPITAL_COMMUNITY): Payer: Self-pay | Admitting: *Deleted

## 2021-04-06 ENCOUNTER — Telehealth (HOSPITAL_COMMUNITY): Payer: Self-pay | Admitting: *Deleted

## 2021-04-06 NOTE — Telephone Encounter (Signed)
Patient given detailed instructions per Myocardial Perfusion Study Information Sheet for the test on 04/13/21 at [place]. Patient notified to arrive 15 minutes early and that it is imperative to arrive on time for appointment to keep from having the test rescheduled.  If you need to cancel or reschedule your appointment, please call the office within 24 hours of your appointment. . Patient verbalized understanding.Angel Gutierrez, [place] No mychart

## 2021-04-13 ENCOUNTER — Other Ambulatory Visit (HOSPITAL_COMMUNITY): Payer: PRIVATE HEALTH INSURANCE

## 2021-04-13 ENCOUNTER — Encounter (HOSPITAL_COMMUNITY): Payer: PRIVATE HEALTH INSURANCE

## 2021-05-02 ENCOUNTER — Telehealth (HOSPITAL_COMMUNITY): Payer: Self-pay | Admitting: *Deleted

## 2021-05-02 NOTE — Telephone Encounter (Signed)
Patient given detailed instructions per Myocardial Perfusion Study Information Sheet for the test on 05/06/20 at 7:45. Patient notified to arrive 15 minutes early and that it is imperative to arrive on time for appointment to keep from having the test rescheduled.  If you need to cancel or reschedule your appointment, please call the office within 24 hours of your appointment. . Patient verbalized understanding.Angel Gutierrez

## 2021-05-03 ENCOUNTER — Ambulatory Visit: Payer: PRIVATE HEALTH INSURANCE | Admitting: Cardiovascular Disease

## 2021-05-06 ENCOUNTER — Ambulatory Visit (HOSPITAL_COMMUNITY): Payer: PRIVATE HEALTH INSURANCE | Attending: Cardiovascular Disease

## 2021-05-06 ENCOUNTER — Ambulatory Visit (HOSPITAL_COMMUNITY): Payer: PRIVATE HEALTH INSURANCE

## 2021-05-09 ENCOUNTER — Encounter (HOSPITAL_COMMUNITY): Payer: Self-pay | Admitting: Cardiovascular Disease

## 2023-01-06 ENCOUNTER — Other Ambulatory Visit: Payer: Self-pay

## 2023-01-06 ENCOUNTER — Emergency Department (HOSPITAL_COMMUNITY): Payer: Self-pay

## 2023-01-06 ENCOUNTER — Observation Stay (HOSPITAL_COMMUNITY)
Admission: EM | Admit: 2023-01-06 | Discharge: 2023-01-07 | Disposition: A | Discharge status: Home / Self Care | Payer: Self-pay | Attending: Internal Medicine | Admitting: Internal Medicine

## 2023-01-06 DIAGNOSIS — R079 Chest pain, unspecified: Secondary | ICD-10-CM | POA: Diagnosis present

## 2023-01-06 DIAGNOSIS — Z87891 Personal history of nicotine dependence: Secondary | ICD-10-CM | POA: Insufficient documentation

## 2023-01-06 DIAGNOSIS — I959 Hypotension, unspecified: Secondary | ICD-10-CM | POA: Insufficient documentation

## 2023-01-06 DIAGNOSIS — I200 Unstable angina: Secondary | ICD-10-CM | POA: Diagnosis present

## 2023-01-06 DIAGNOSIS — E785 Hyperlipidemia, unspecified: Secondary | ICD-10-CM | POA: Diagnosis present

## 2023-01-06 DIAGNOSIS — Z79899 Other long term (current) drug therapy: Secondary | ICD-10-CM | POA: Insufficient documentation

## 2023-01-06 DIAGNOSIS — R55 Syncope and collapse: Principal | ICD-10-CM

## 2023-01-06 DIAGNOSIS — Z8679 Personal history of other diseases of the circulatory system: Secondary | ICD-10-CM

## 2023-01-06 DIAGNOSIS — I25110 Atherosclerotic heart disease of native coronary artery with unstable angina pectoris: Principal | ICD-10-CM | POA: Insufficient documentation

## 2023-01-06 DIAGNOSIS — N179 Acute kidney failure, unspecified: Secondary | ICD-10-CM

## 2023-01-06 DIAGNOSIS — R42 Dizziness and giddiness: Secondary | ICD-10-CM

## 2023-01-06 DIAGNOSIS — E872 Acidosis, unspecified: Secondary | ICD-10-CM | POA: Insufficient documentation

## 2023-01-06 DIAGNOSIS — K5732 Diverticulitis of large intestine without perforation or abscess without bleeding: Secondary | ICD-10-CM | POA: Insufficient documentation

## 2023-01-06 DIAGNOSIS — I10 Essential (primary) hypertension: Secondary | ICD-10-CM | POA: Diagnosis present

## 2023-01-06 DIAGNOSIS — J45909 Unspecified asthma, uncomplicated: Secondary | ICD-10-CM | POA: Insufficient documentation

## 2023-01-06 LAB — CBC WITH DIFFERENTIAL/PLATELET
Abs Immature Granulocytes: 0.03 10*3/uL (ref 0.00–0.07)
Basophils Absolute: 0.1 10*3/uL (ref 0.0–0.1)
Basophils Relative: 1 %
Eosinophils Absolute: 0.1 10*3/uL (ref 0.0–0.5)
Eosinophils Relative: 1 %
HCT: 42.9 % (ref 39.0–52.0)
Hemoglobin: 14 g/dL (ref 13.0–17.0)
Immature Granulocytes: 0 %
Lymphocytes Relative: 21 %
Lymphs Abs: 2 10*3/uL (ref 0.7–4.0)
MCH: 29.5 pg (ref 26.0–34.0)
MCHC: 32.6 g/dL (ref 30.0–36.0)
MCV: 90.5 fL (ref 80.0–100.0)
Monocytes Absolute: 0.8 10*3/uL (ref 0.1–1.0)
Monocytes Relative: 8 %
Neutro Abs: 6.7 10*3/uL (ref 1.7–7.7)
Neutrophils Relative %: 69 %
Platelets: 278 10*3/uL (ref 150–400)
RBC: 4.74 MIL/uL (ref 4.22–5.81)
RDW: 12.8 % (ref 11.5–15.5)
WBC: 9.7 10*3/uL (ref 4.0–10.5)
nRBC: 0 % (ref 0.0–0.2)

## 2023-01-06 LAB — TROPONIN I (HIGH SENSITIVITY)
Troponin I (High Sensitivity): 6 ng/L (ref <18)
Troponin I (High Sensitivity): 13 ng/L (ref <18)

## 2023-01-06 LAB — I-STAT CHEM 8, ED
BUN: 9 mg/dL (ref 6–20)
Calcium, Ion: 1.11 mmol/L — ABNORMAL LOW (ref 1.15–1.40)
Chloride: 103 mmol/L (ref 98–111)
Creatinine, Ser: 1.2 mg/dL (ref 0.61–1.24)
Glucose, Bld: 98 mg/dL (ref 70–99)
HCT: 42 % (ref 39.0–52.0)
Hemoglobin: 14.3 g/dL (ref 13.0–17.0)
Potassium: 4.3 mmol/L (ref 3.5–5.1)
Sodium: 137 mmol/L (ref 135–145)
TCO2: 22 mmol/L (ref 22–32)

## 2023-01-06 LAB — COMPREHENSIVE METABOLIC PANEL
ALT: 16 U/L (ref 0–44)
AST: 20 U/L (ref 15–41)
Albumin: 3.5 g/dL (ref 3.5–5.0)
Alkaline Phosphatase: 55 U/L (ref 38–126)
Anion gap: 13 (ref 5–15)
BUN: 10 mg/dL (ref 6–20)
CO2: 21 mmol/L — ABNORMAL LOW (ref 22–32)
Calcium: 8.9 mg/dL (ref 8.9–10.3)
Chloride: 103 mmol/L (ref 98–111)
Creatinine, Ser: 1.51 mg/dL — ABNORMAL HIGH (ref 0.61–1.24)
GFR, Estimated: 56 mL/min — ABNORMAL LOW (ref >60)
Glucose, Bld: 112 mg/dL — ABNORMAL HIGH (ref 70–99)
Potassium: 4.5 mmol/L (ref 3.5–5.1)
Sodium: 137 mmol/L (ref 135–145)
Total Bilirubin: 0.9 mg/dL (ref 0.3–1.2)
Total Protein: 6.3 g/dL — ABNORMAL LOW (ref 6.5–8.1)

## 2023-01-06 LAB — CBG MONITORING, ED: Glucose-Capillary: 112 mg/dL — ABNORMAL HIGH (ref 70–99)

## 2023-01-06 MED ORDER — IOHEXOL 350 MG/ML SOLN
100.0000 mL | Freq: Once | INTRAVENOUS | Status: AC | PRN
  Administered 2023-01-06: 100 mL via INTRAVENOUS

## 2023-01-06 MED ORDER — LACTATED RINGERS IV BOLUS
1000.0000 mL | Freq: Once | INTRAVENOUS | Status: AC
  Administered 2023-01-06: 1000 mL via INTRAVENOUS

## 2023-01-06 NOTE — ED Provider Notes (Signed)
[place] [facility]ENC[facility] DEPARTMENT AT [facility] Provider Note   CSN: [id number] Arrival date & time: 01/06/23  1838     History  Chief Complaint  Patient presents with   Chest Pain    Angel Gutierrez is a 52 y.o. male.   Pt is a 52y/o male with hx of CAD status post bare metal stent to his mid RCA in 2012, drug-eluting stent to the AV groove circumflex by Dr. [place] 2021 with a follow-up cath several days later revealing all stents to be widely patent, hypertension and hyperlipidemia who has not use cigarettes or alcohol in over 5 years who is presenting today with complaints of chest pain.  Patient reports that he was working in his wood shop and more itching the game when he suddenly developed intense pain in the center of his chest that was radiating down into his left arm and up into his jaw.  He checked his blood pressure at the time and it was elevated at 160 systolic.  He took nitro at home and the pain continued to worsen and blood pressure went up to 190.  When EMS arrived patient was still having active pain which she reports went straight through the middle and down his arm and he could even feel it in his throat.  He denied any pain in his abdomen.  He was given nitro and aspirin by EMS and when he presented here he reports the pain is still about a 6 out of 10.  He then suddenly had decreased mental status, sweating and paleness and blood pressure dropped into the 60s.  Patient was placed in reverse Trendelenburg and a fluid bolus was started.  He quickly regained consciousness and reported he was still having chest pain but it was starting to improve.  He denies any nausea or vomiting.  He has not had any recent surgeries or immobilization.  He has had no unilateral leg pain or swelling.  He reports this feels similar when he had his last heart attack.        Home Medications Prior to Admission medications   Medication Sig Start Date End Date Taking? Authorizing Provider   albuterol (PROVENTIL HFA;VENTOLIN HFA) 108 (90 BASE) MCG/ACT inhaler Inhale 2 puffs into the lungs every 4 (four) hours as needed for wheezing or shortness of breath.    [provider]  amphetamine-dextroamphetamine (ADDERALL XR) 30 MG 24 hr capsule Take 30 mg by mouth every morning. 11/12/20   [provider]  atorvastatin (LIPITOR) 40 MG tablet Take 1 tablet by mouth daily. Patient not taking: Reported on 03/23/2021 12/10/14   [provider]  bisoprolol (ZEBETA) 5 MG tablet Take 0.5 tablets (2.5 mg total) by mouth daily. 01/28/14   Azalee Course, PA  DULoxetine (CYMBALTA) 30 MG capsule Take 30 mg by mouth daily. 11/06/20   [provider]  EPINEPHrine 0.3 mg/0.3 mL IJ SOAJ injection Inject 0.3 mLs (0.3 mg total) into the muscle once. Patient not taking: Reported on 03/23/2021 04/18/15   Lyndal Pulley, MD  fluticasone [facility]) 50 MCG/ACT nasal spray Place 1 spray into both nostrils as needed. Patient not taking: Reported on 03/23/2021 08/25/20   [provider]  isosorbide mononitrate (IMDUR) 30 MG 24 hr tablet Take 0.5 tablets (15 mg total) by mouth daily. 01/26/15   Barrett, Joline Salt, PA-C  montelukast (SINGULAIR) 10 MG tablet Take 10 mg by mouth daily in the afternoon. 02/15/21   [provider]  Multiple Vitamins-Minerals (MULTIVITAMIN WITH  MINERALS) tablet Take 1 tablet by mouth daily.    [provider]  nitroGLYCERIN (NITROSTAT) 0.4 MG SL tablet Place 0.4 mg under the tongue every 5 (five) minutes as needed for chest pain.    [provider]  pantoprazole (PROTONIX) 40 MG tablet Take 40 mg by mouth daily. 11/12/20   [provider]  rosuvastatin (CRESTOR) 20 MG tablet Take 1 tablet by mouth every evening.    [provider]  SYMBICORT 80-4.5 MCG/ACT inhaler Inhale 1 puff into the lungs daily. 09/22/20   [provider]  valsartan (DIOVAN) 160 MG tablet Take 1 tablet (160 mg total) by mouth  daily. Patient not taking: Reported on 03/23/2021 06/08/14   Nyoka Cowden, MD  ZYRTEC ALLERGY 10 MG CAPS Take 1 tablet by mouth daily. Patient not taking: Reported on 03/23/2021 08/13/20   [provider]      Allergies    Dust mite extract, Egg-derived products, Influenza vaccines, Metoprolol, and Vicodin [hydrocodone-acetaminophen]    Review of Systems   Review of Systems  Physical Exam Updated Vital Signs BP 112/68   Pulse 70   Temp 98.1 F (36.7 C) (Oral)   Resp 10   SpO2 100%  Physical Exam Vitals and nursing note reviewed.  Constitutional:      General: He is in acute distress.     Appearance: He is well-developed. He is ill-appearing and diaphoretic.  HENT:     Head: Normocephalic and atraumatic.  Eyes:     Conjunctiva/sclera: Conjunctivae normal.     Pupils: Pupils are equal, round, and reactive to light.  Cardiovascular:     Rate and Rhythm: Normal rate and regular rhythm.     Pulses: Normal pulses.     Heart sounds: No murmur heard.    Comments: Upper extremity pulses are equal bilaterally diminished in the feet Pulmonary:     Effort: Pulmonary effort is normal. No respiratory distress.     Breath sounds: Normal breath sounds. No wheezing or rales.  Abdominal:     General: There is no distension.     Palpations: Abdomen is soft.     Tenderness: There is no abdominal tenderness. There is no guarding or rebound.  Musculoskeletal:        General: No tenderness. Normal range of motion.     Cervical back: Normal range of motion and neck supple.  Skin:    General: Skin is warm.     Coloration: Skin is pale.     Findings: No erythema or rash.  Neurological:     Mental Status: He is alert and oriented to person, place, and time. Mental status is at baseline.  Psychiatric:        Behavior: Behavior normal.     ED Results / Procedures / Treatments   Labs (all labs ordered are listed, but only abnormal results are displayed) Labs Reviewed   COMPREHENSIVE METABOLIC PANEL - Abnormal; Notable for the following components:      Result Value   CO2 21 (*)    Glucose, Bld 112 (*)    Creatinine, Ser 1.51 (*)    Total Protein 6.3 (*)    GFR, Estimated 56 (*)    All other components within normal limits  CBG MONITORING, ED - Abnormal; Notable for the following components:   Glucose-Capillary 112 (*)    All other components within normal limits  I-STAT CHEM 8, ED - Abnormal; Notable for the following components:   Calcium, Ion 1.11 (*)  All other components within normal limits  CBC WITH DIFFERENTIAL/PLATELET  TROPONIN I (HIGH SENSITIVITY)  TROPONIN I (HIGH SENSITIVITY)    EKG EKG Interpretation Date/Time:  Saturday January 06 2023 19:48:00 EDT Ventricular Rate:  100 PR Interval:  175 QRS Duration:  103 QT Interval:  349 QTC Calculation: 451 R Axis:   75  Text Interpretation: Sinus tachycardia No significant change since last tracing Confirmed by Gwyneth Sprout ([id number]) on 01/06/2023 7:56:12 PM  Radiology CT Angio Chest/Abd/Pel for Dissection W and/or Wo Contrast  Result Date: 01/06/2023 CLINICAL DATA:  Per EMS run sheet- chest pain, anxiety worries, dizziness and giddiness, shortness of breath EXAM: CT ANGIOGRAPHY CHEST, ABDOMEN AND PELVIS TECHNIQUE: Non-contrast CT of the chest was initially obtained. Multidetector CT imaging through the chest, abdomen and pelvis was performed using the standard protocol during bolus administration of intravenous contrast. Multiplanar reconstructed images and MIPs were obtained and reviewed to evaluate the vascular anatomy. RADIATION DOSE REDUCTION: This exam was performed according to the departmental dose-optimization program which includes automated exposure control, adjustment of the mA and/or kV according to patient size and/or use of iterative reconstruction technique. CONTRAST:  OMNIPAQUE IOHEXOL 350 MG/ML SOLN COMPARISON:  Chest radiograph 01/04/2015 FINDINGS: CTA CHEST  FINDINGS Cardiovascular: Coronary artery and aortic atherosclerotic calcification. No pericardial effusion. No aortic aneurysm or dissection. Mediastinum/Nodes: No thoracic adenopathy by size. Unremarkable trachea and esophagus. Lungs/Pleura: Mild air trapping. No focal consolidation, pleural effusion, or pneumothorax. Musculoskeletal: No acute fracture. Review of the MIP images confirms the above findings. CTA ABDOMEN AND PELVIS FINDINGS VASCULAR Aorta: Advanced mixed density atherosclerotic plaque throughout the abdominal aorta without aneurysm or dissection. There is mild narrowing of the infrarenal abdominal. Celiac: Normal variant configuration of the celiac axis with the left gastric and splenic arteries arising at the aorta. No aneurysm or dissection. SMA: Patent without aneurysm or dissection. Renals: Patent without aneurysm or dissection. Accessory left renal artery. IMA: Patent. Inflow: Scattered atherosclerotic plaque without severe stenosis. No aneurysm or dissection. Veins: No obvious venous abnormality within the limitations of this arterial phase study. Review of the MIP images confirms the above findings. NON-VASCULAR Hepatobiliary: Hepatic steatosis. Unremarkable gallbladder and biliary tree. Pancreas: Unremarkable. Spleen: Unremarkable. Adrenals/Urinary Tract: Unremarkable adrenal glands. No urinary calculi or hydronephrosis. Unremarkable bladder. Stomach/Bowel: Normal caliber large and small bowel. Sigmoid diverticulosis. There is mild wall thickening and trace adjacent stranding about the sigmoid colon. No free air or abscess. Normal appendix and stomach. Lymphatic: No enlarged lymph nodes. Reproductive: Unremarkable. Other: Small fat containing umbilical hernia. Small fat containing left inguinal hernia. Musculoskeletal: No acute fracture. Review of the MIP images confirms the above findings. IMPRESSION: 1. No aortic aneurysm or dissection. 2. Mild uncomplicated sigmoid diverticulitis. Consider  follow-up colonoscopy following resolution of patient's acute symptoms to exclude underlying mass. 3. Hepatic steatosis. 4. Mild air trapping in the lungs which can be seen with constrictive bronchiolitis. Aortic Atherosclerosis (ICD10-I70.0). Electronically Signed   By: Minerva Fester M.D.   On: 01/06/2023 19:53    Procedures Procedures    Medications Ordered in ED Medications  lactated ringers bolus 1,000 mL (0 mLs Intravenous Stopped 01/06/23 1915)  iohexol (OMNIPAQUE) 350 MG/ML injection 100 mL (100 mLs Intravenous Contrast Given 01/06/23 1934)    ED Course/ Medical Decision Making/ A&P                                 Medical Decision Making Amount and/or Complexity of  Data Reviewed Independent Historian: EMS External Data Reviewed: notes. Labs: ordered. Decision-making details documented in ED Course. Radiology: ordered and independent interpretation performed. Decision-making details documented in ED Course. ECG/medicine tests: ordered and independent interpretation performed. Decision-making details documented in ED Course.  Risk Prescription drug management. Decision regarding hospitalization.   Pt with multiple medical problems and comorbidities and presenting today with a complaint that caries a high risk for morbidity and mortality.  Today with chest pain.  Initially patient was hypertensive but upon arrival here became significantly hypotensive with a brief loss of consciousness.  Patient was given a fluid bolus and blood pressure improved to 111/70.  He is still having chest pain which sounds concerning for unstable angina/STEMI versus dissection.  Low suspicion for GI etiology at this time.  Also low suspicion for PE.  Patient's initial EKG which I independently interpreted showed a sinus rhythm without significant ST changes.  He had 2 subsequent EKGs which show no evidence of ST elevation at this time.  Patient went as a code medical over for CTA.  I have independently  visualized and interpreted pt's images today.  CTA without evidence of dissection or acute abdominal process.  Radiology reports no aortic aneurysm or dissection, mild uncomplicated sigmoid diverticulitis and some air trapping.  On repeat evaluation patient's blood pressure is in the high 90s and he was continued on IV fluids.  Labs are pending.  Patient reports pain is improved to 5 out of 10.  10:42 PM I independently interpreted patient's labs and initial troponin was 6 and repeat was 13, i-STAT without acute findings, CBC within normal limits and CMP with mild AKI with creatinine 1.51.  After fluids patient's blood pressure has improved and is 112/68.  On repeat evaluation now he reports just a slight tightness in his chest but no shortness of breath.  Given patient's history, 2 episodes of syncope 1 at home and 1 in the emergency room with significant hypotension in the setting of chest pain without specific cause feel that he would benefit from admission, cycling his enzymes possible cardiology consult in the morning.  Discussed this with the patient and his wife.  He is comfortable with this plan Patient CT did show diverticulitis possibly however patient has no abdominal pain reports he has not had abdominal pain and do not feel that he needs antibiotic treatment at this time.          Final Clinical Impression(s) / ED Diagnoses Final diagnoses:  Syncope, unspecified syncope type  Hypotension, unspecified hypotension type  Chest pain, unspecified type    Rx / DC Orders ED Discharge Orders     None         Gwyneth Sprout, MD 01/06/23 2242

## 2023-01-06 NOTE — ED Notes (Signed)
Patient currently lethargic, chin on chest and increased diaphoresis. Patient BP 65/44, ED provider Bjorn Pippin, [place] made aware and fluids started. Patient placed in reverse Trendelenburg as well.

## 2023-01-06 NOTE — H&P (Incomplete)
History and Physical    [name]:[id number] DOB: 1971-01-23 DOA: 01/06/2023  PCP: Royann Shivers, PA-C   Patient coming from: Home   Chief Complaint:  Chief Complaint  Patient presents with  . Chest Pain    HPI:  Angel Gutierrez is a 52 y.o. male with medical history significant of CAD status post bare-metal stent mid RCA 2012, last heart cath in 2021 which showed all stents are widely patent, hypertension, hyperlipidemia and unstable angina scented to emergency department via EMS for evaluation for chest pain. Per chart review of EMS and ED physician note, Patient reports that he was working in his wood shop and more itching the game when he suddenly developed intense pain in the center of his chest that was radiating down into his left arm and up into his jaw.  He checked his blood pressure at the time and it was elevated at 160 systolic.  He took nitro at home and the pain continued to worsen and blood pressure went up to 190.  When EMS arrived patient was still having active pain which she reports went straight through the middle and down his arm and he could even feel it in his throat.  He denied any pain in his abdomen.  He was given nitro and aspirin by EMS and when he presented here he reports the pain is still about a 6 out of 10.  He then suddenly had decreased mental status, sweating and paleness and blood pressure dropped into the 60s.  Patient was placed in reverse Trendelenburg and a fluid bolus was started.  He quickly regained consciousness and reported he was still having chest pain but it was starting to improve.  He denies any nausea or vomiting.  He has not had any recent surgeries or immobilization.  He has had no unilateral leg pain or swelling.  He reports this feels similar when he had his last heart attack.     ED Course:  At presentation to ED patient found hypotensive 65/44, bradycardic 57, respiratory to 18 and O2 sat 100% on 2 L. EKG showed sinus  tachycardia heart rate 100.  There is no history of abnormality. Troponin 6 and 13. CMP grossly unremarkable except low bicarb 19, elevated creatinine 1.51. CBC grossly unremarkable.  CTA chest and abdomen pelvis: IMPRESSION: 1. No aortic aneurysm or dissection. 2. Mild uncomplicated sigmoid diverticulitis. Consider follow-up colonoscopy following resolution of patient's acute symptoms to exclude underlying mass. 3. Hepatic steatosis. 4. Mild air trapping in the lungs which can be seen with constrictive bronchiolitis.  In the ED patient has been treated with 1 L of LR bolus with that blood blood pressure has been improved 65/44 to 1125/68.   Review of Systems:  ROS  Past Medical History:  Diagnosis Date  . Alcoholism (HCC)   . Allergy   . Anxiety   . Asthma   . Attention and concentration deficit   . CHF (congestive heart failure) (HCC)   . Coronary artery disease    a.  s/p BMS to LCx in (2012); DES to RCA on 01/27/14  . Depression   . Emphysema of lung (HCC)   . GERD (gastroesophageal reflux disease)   . Hyperlipidemia   . Hypertension   . Myocardial infarction (HCC) 10/12/2010    Past Surgical History:  Procedure Laterality Date  . CARDIAC CATHETERIZATION  01/04/2015   Patent stents in the RCA and CFX, LAD 35%, preserved LV function  . CARDIAC CATHETERIZATION N/A 01/04/2015  Procedure: Left Heart Cath and Coronary Angiography;  Surgeon: Runell Gess, MD;  Location: [facility] INVASIVE CV LAB;  Service: Cardiovascular;  Laterality: N/A;  . CARDIAC CATHETERIZATION    . CORONARY STENT PLACEMENT  09/23/2010   BMS to the CFX  . FASCIOTOMY Right 01/04/2015   Procedure: Left Forearm Fasciotomy;  Surgeon: Sherren Kerns, MD;  Location: [facility] OR;  Service: Vascular;  Laterality: Right;  . FASCIOTOMY CLOSURE Right 01/06/2015   Procedure: RIGHT ARM FASCIOTOMY CLOSURE;  Surgeon: Sherren Kerns, MD;  Location: [facility] OR;  Service: Vascular;  Laterality: Right;  . LEFT HEART  CATHETERIZATION WITH CORONARY ANGIOGRAM N/A 01/27/2014   Procedure: LEFT HEART CATHETERIZATION WITH CORONARY ANGIOGRAM;  Surgeon: Peter M Swaziland, MD;  Location: [facility] CATH LAB;  Service: Cardiovascular;  Laterality: N/A;  . LEFT HEART CATHETERIZATION WITH CORONARY ANGIOGRAM N/A 01/30/2014   Procedure: LEFT HEART CATHETERIZATION WITH CORONARY ANGIOGRAM;  Surgeon: Corky Crafts, MD;  Location: [facility] CATH LAB;  Service: Cardiovascular;  Laterality: N/A;  . PERCUTANEOUS CORONARY STENT INTERVENTION (PCI-S)  01/27/2014   Procedure: PERCUTANEOUS CORONARY STENT INTERVENTION (PCI-S);  Surgeon: Peter M Swaziland, MD;  Location: [facility] CATH LAB;  Service: Cardiovascular;;  . REPLANTATION THUMB Left   . WISDOM TOOTH EXTRACTION       reports that he quit smoking about 11 years ago. His smoking use included cigarettes. He started smoking about 33 years ago. He has a 22 pack-year smoking history. He has never used smokeless tobacco. He reports that he does not currently use alcohol. He reports that he does not use drugs.  Allergies  Allergen Reactions  . Dust Mite Extract Anaphylaxis and Rash  . Egg-Derived Products Anaphylaxis  . Influenza Vaccines     ALLERGY TO EGGS  . Metoprolol Shortness Of Breath    Mild increase in SOB related to asthma  . Vicodin [Hydrocodone-Acetaminophen] Nausea And Vomiting    Family History  Problem Relation Age of Onset  . Hypertension Mother   . Diverticulitis Mother   . Pancreatic cancer Father        pancreatic  . Heart attack Father   . Heart disease Father        CAD  . Liver cancer Father   . Heart disease Maternal Grandmother   . Heart disease Paternal Grandmother   . Heart attack Paternal Grandfather   . Heart disease Paternal Grandfather        CAD  . Autism Son   . Colon cancer Neg Hx   . Colon polyps Neg Hx   . Esophageal cancer Neg Hx   . Rectal cancer Neg Hx   . Stomach cancer Neg Hx     Prior to Admission medications   Medication Sig Start Date End  Date Taking? Authorizing Provider  albuterol (PROVENTIL HFA;VENTOLIN HFA) 108 (90 BASE) MCG/ACT inhaler Inhale 2 puffs into the lungs every 4 (four) hours as needed for wheezing or shortness of breath.    [provider]  amphetamine-dextroamphetamine (ADDERALL XR) 30 MG 24 hr capsule Take 30 mg by mouth every morning. 11/12/20   [provider]  atorvastatin (LIPITOR) 40 MG tablet Take 1 tablet by mouth daily. Patient not taking: Reported on 03/23/2021 12/10/14   [provider]  bisoprolol (ZEBETA) 5 MG tablet Take 0.5 tablets (2.5 mg total) by mouth daily. 01/28/14   Azalee Course, PA  DULoxetine (CYMBALTA) 30 MG capsule Take 30 mg by mouth daily. 11/06/20   [provider]  EPINEPHrine 0.3 mg/0.3  mL IJ SOAJ injection Inject 0.3 mLs (0.3 mg total) into the muscle once. Patient not taking: Reported on 03/23/2021 04/18/15   Lyndal Pulley, MD  fluticasone [facility]) 50 MCG/ACT nasal spray Place 1 spray into both nostrils as needed. Patient not taking: Reported on 03/23/2021 08/25/20   [provider]  isosorbide mononitrate (IMDUR) 30 MG 24 hr tablet Take 0.5 tablets (15 mg total) by mouth daily. 01/26/15   Barrett, Joline Salt, PA-C  montelukast (SINGULAIR) 10 MG tablet Take 10 mg by mouth daily in the afternoon. 02/15/21   [provider]  Multiple Vitamins-Minerals (MULTIVITAMIN WITH MINERALS) tablet Take 1 tablet by mouth daily.    [provider]  nitroGLYCERIN (NITROSTAT) 0.4 MG SL tablet Place 0.4 mg under the tongue every 5 (five) minutes as needed for chest pain.    [provider]  pantoprazole (PROTONIX) 40 MG tablet Take 40 mg by mouth daily. 11/12/20   [provider]  rosuvastatin (CRESTOR) 20 MG tablet Take 1 tablet by mouth every evening.    [provider]  SYMBICORT 80-4.5 MCG/ACT inhaler Inhale 1 puff into the lungs daily. 09/22/20   [provider]  valsartan (DIOVAN) 160 MG tablet Take 1 tablet  (160 mg total) by mouth daily. Patient not taking: Reported on 03/23/2021 06/08/14   Nyoka Cowden, MD  ZYRTEC ALLERGY 10 MG CAPS Take 1 tablet by mouth daily. Patient not taking: Reported on 03/23/2021 08/13/20   [provider]     Physical Exam: Vitals:   01/06/23 2100 01/06/23 2130 01/06/23 2200 01/06/23 2230  BP: 99/65 105/61 107/65 112/68  Pulse: 87 84 77 70  Resp: 18 (!) 23 (!) 23 10  Temp:      TempSrc:      SpO2: 100% 100% 100% 100%    Physical Exam   Labs on Admission: I have personally reviewed following labs and imaging studies  CBC: Recent Labs  Lab 01/06/23 1948 01/06/23 2023  WBC 9.7  --   NEUTROABS 6.7  --   HGB 14.0 14.3  HCT 42.9 42.0  MCV 90.5  --   PLT 278  --    Basic Metabolic Panel: Recent Labs  Lab 01/06/23 1948 01/06/23 2023  NA 137 137  K 4.5 4.3  CL 103 103  CO2 21*  --   GLUCOSE 112* 98  BUN 10 9  CREATININE 1.51* 1.20  CALCIUM 8.9  --    GFR: CrCl cannot be calculated (Unknown ideal weight.). Liver Function Tests: Recent Labs  Lab 01/06/23 1948  AST 20  ALT 16  ALKPHOS 55  BILITOT 0.9  PROT 6.3*  ALBUMIN 3.5   No results for input(s): "LIPASE", "AMYLASE" in the last 168 hours. No results for input(s): "AMMONIA" in the last 168 hours. Coagulation Profile: No results for input(s): "INR", "PROTIME" in the last 168 hours. Cardiac Enzymes: Recent Labs  Lab 01/06/23 1948 01/06/23 2145  TROPONINIHS 6 13   BNP (last 3 results) No results for input(s): "BNP" in the last 8760 hours. HbA1C: No results for input(s): "HGBA1C" in the last 72 hours. CBG: Recent Labs  Lab 01/06/23 1906  GLUCAP 112*   Lipid Profile: No results for input(s): "CHOL", "HDL", "LDLCALC", "TRIG", "CHOLHDL", "LDLDIRECT" in the last 72 hours. Thyroid Function Tests: No results for input(s): "TSH", "T4TOTAL", "FREET4", "T3FREE", "THYROIDAB" in the last 72 hours. Anemia Panel: No results for input(s): "VITAMINB12", "FOLATE", "FERRITIN",  "TIBC", "IRON", "RETICCTPCT" in the last 72 hours. Urine analysis: No  results found for: "COLORURINE", "APPEARANCEUR", "LABSPEC", "PHURINE", "GLUCOSEU", "HGBUR", "BILIRUBINUR", "KETONESUR", "PROTEINUR", "UROBILINOGEN", "NITRITE", "LEUKOCYTESUR"  Radiological Exams on Admission: I have personally reviewed images CT Angio Chest/Abd/Pel for Dissection W and/or Wo Contrast  Result Date: 01/06/2023 CLINICAL DATA:  Per EMS run sheet- chest pain, anxiety worries, dizziness and giddiness, shortness of breath EXAM: CT ANGIOGRAPHY CHEST, ABDOMEN AND PELVIS TECHNIQUE: Non-contrast CT of the chest was initially obtained. Multidetector CT imaging through the chest, abdomen and pelvis was performed using the standard protocol during bolus administration of intravenous contrast. Multiplanar reconstructed images and MIPs were obtained and reviewed to evaluate the vascular anatomy. RADIATION DOSE REDUCTION: This exam was performed according to the departmental dose-optimization program which includes automated exposure control, adjustment of the mA and/or kV according to patient size and/or use of iterative reconstruction technique. CONTRAST:  OMNIPAQUE IOHEXOL 350 MG/ML SOLN COMPARISON:  Chest radiograph 01/04/2015 FINDINGS: CTA CHEST FINDINGS Cardiovascular: Coronary artery and aortic atherosclerotic calcification. No pericardial effusion. No aortic aneurysm or dissection. Mediastinum/Nodes: No thoracic adenopathy by size. Unremarkable trachea and esophagus. Lungs/Pleura: Mild air trapping. No focal consolidation, pleural effusion, or pneumothorax. Musculoskeletal: No acute fracture. Review of the MIP images confirms the above findings. CTA ABDOMEN AND PELVIS FINDINGS VASCULAR Aorta: Advanced mixed density atherosclerotic plaque throughout the abdominal aorta without aneurysm or dissection. There is mild narrowing of the infrarenal abdominal. Celiac: Normal variant configuration of the celiac axis with the left  gastric and splenic arteries arising at the aorta. No aneurysm or dissection. SMA: Patent without aneurysm or dissection. Renals: Patent without aneurysm or dissection. Accessory left renal artery. IMA: Patent. Inflow: Scattered atherosclerotic plaque without severe stenosis. No aneurysm or dissection. Veins: No obvious venous abnormality within the limitations of this arterial phase study. Review of the MIP images confirms the above findings. NON-VASCULAR Hepatobiliary: Hepatic steatosis. Unremarkable gallbladder and biliary tree. Pancreas: Unremarkable. Spleen: Unremarkable. Adrenals/Urinary Tract: Unremarkable adrenal glands. No urinary calculi or hydronephrosis. Unremarkable bladder. Stomach/Bowel: Normal caliber large and small bowel. Sigmoid diverticulosis. There is mild wall thickening and trace adjacent stranding about the sigmoid colon. No free air or abscess. Normal appendix and stomach. Lymphatic: No enlarged lymph nodes. Reproductive: Unremarkable. Other: Small fat containing umbilical hernia. Small fat containing left inguinal hernia. Musculoskeletal: No acute fracture. Review of the MIP images confirms the above findings. IMPRESSION: 1. No aortic aneurysm or dissection. 2. Mild uncomplicated sigmoid diverticulitis. Consider follow-up colonoscopy following resolution of patient's acute symptoms to exclude underlying mass. 3. Hepatic steatosis. 4. Mild air trapping in the lungs which can be seen with constrictive bronchiolitis. Aortic Atherosclerosis (ICD10-I70.0). Electronically Signed   By: Minerva Fester M.D.   On: 01/06/2023 19:53    EKG: My personal interpretation of EKG shows: ***    Assessment/Plan: Active Problems:   * No active hospital problems. *    Assessment and Plan: No notes have been filed under this hospital service. Service: Hospitalist      DVT prophylaxis:  {Blank single:19197::"Lovenox","SQ Heparin","IV heparin  gtts","Xarelto","Eliquis","Coumadin","SCDs","***"} Code Status:  {Blank single:19197::"Full Code","DNR with Intubation","DNR/DNI(Do NOT Intubate)","Comfort Care","***"} Diet:  Family Communication:  ***  Disposition Plan:  ***  Consults:  ***  Admission status:   {Blank single:19197::"Observation","Inpatient"}, {Blank single:19197::"Med-Surg","Telemetry bed","Step Down Unit"}  Severity of Illness: {Observation/Inpatient:21159}    Tereasa Coop, MD [facility]  How to contact the [facility] Attending or Consulting provider 7A - 7P or covering provider during after hours 7P -7A, for this patient.  Check the care team in [facility] and look for a) attending/consulting [facility]  provider listed and b) the [facility] team listed Log into [email or web address] and use [place]'s universal password to access. If you do not have the password, please contact the hospital operator. Locate the [facility] provider you are looking for under [facility] and page to a number that you can be directly reached. If you still have difficulty reaching the provider, please page the [facility] (Director on Call) for the Hospitalists listed on amion for assistance.  01/06/2023, 10:53 PM

## 2023-01-06 NOTE — ED Triage Notes (Signed)
Patient BIB [facility] from home for sudden onset of 9/10 CP radiating to left arm, jaw and back. Patient had near syncopal episode and called for help to family who called 911. On EMS arrival patient BP was 200/100 and improved with administration of 3 doses of nitroglycerin to 179/86. Patient CP improved to 7/10 as well. Patient presents to ED pale, diaphoretic, and in obvious discomfort. A&Ox4

## 2023-01-07 ENCOUNTER — Inpatient Hospital Stay (HOSPITAL_BASED_OUTPATIENT_CLINIC_OR_DEPARTMENT_OTHER): Payer: Self-pay

## 2023-01-07 ENCOUNTER — Encounter (HOSPITAL_COMMUNITY): Payer: Self-pay | Admitting: Internal Medicine

## 2023-01-07 ENCOUNTER — Other Ambulatory Visit: Payer: Self-pay | Admitting: Student

## 2023-01-07 ENCOUNTER — Other Ambulatory Visit (HOSPITAL_COMMUNITY): Payer: Self-pay

## 2023-01-07 ENCOUNTER — Emergency Department (HOSPITAL_COMMUNITY): Payer: Self-pay

## 2023-01-07 DIAGNOSIS — I2510 Atherosclerotic heart disease of native coronary artery without angina pectoris: Secondary | ICD-10-CM

## 2023-01-07 DIAGNOSIS — R079 Chest pain, unspecified: Secondary | ICD-10-CM

## 2023-01-07 DIAGNOSIS — N179 Acute kidney failure, unspecified: Secondary | ICD-10-CM

## 2023-01-07 DIAGNOSIS — Z955 Presence of coronary angioplasty implant and graft: Secondary | ICD-10-CM

## 2023-01-07 DIAGNOSIS — E872 Acidosis, unspecified: Secondary | ICD-10-CM | POA: Insufficient documentation

## 2023-01-07 DIAGNOSIS — K5732 Diverticulitis of large intestine without perforation or abscess without bleeding: Secondary | ICD-10-CM | POA: Insufficient documentation

## 2023-01-07 DIAGNOSIS — J45909 Unspecified asthma, uncomplicated: Secondary | ICD-10-CM | POA: Insufficient documentation

## 2023-01-07 DIAGNOSIS — R55 Syncope and collapse: Secondary | ICD-10-CM

## 2023-01-07 DIAGNOSIS — I200 Unstable angina: Secondary | ICD-10-CM

## 2023-01-07 DIAGNOSIS — Z8679 Personal history of other diseases of the circulatory system: Secondary | ICD-10-CM

## 2023-01-07 DIAGNOSIS — I959 Hypotension, unspecified: Secondary | ICD-10-CM | POA: Insufficient documentation

## 2023-01-07 DIAGNOSIS — R42 Dizziness and giddiness: Secondary | ICD-10-CM

## 2023-01-07 LAB — ECHOCARDIOGRAM COMPLETE
AR max vel: 3.21 cm2
AV Area VTI: 3.33 cm2
AV Area mean vel: 3.16 cm2
AV Mean grad: 3 mmHg
AV Peak grad: 6.5 mmHg
Ao pk vel: 1.27 m/s
Area-P 1/2: 3.6 cm2
Height: 72 in
S' Lateral: 2.9 cm
Weight: 4024 [oz_av]

## 2023-01-07 LAB — COMPREHENSIVE METABOLIC PANEL
ALT: 16 U/L (ref 0–44)
AST: 12 U/L — ABNORMAL LOW (ref 15–41)
Albumin: 3.3 g/dL — ABNORMAL LOW (ref 3.5–5.0)
Alkaline Phosphatase: 55 U/L (ref 38–126)
Anion gap: 9 (ref 5–15)
BUN: 8 mg/dL (ref 6–20)
CO2: 23 mmol/L (ref 22–32)
Calcium: 8.7 mg/dL — ABNORMAL LOW (ref 8.9–10.3)
Chloride: 104 mmol/L (ref 98–111)
Creatinine, Ser: 1.1 mg/dL (ref 0.61–1.24)
GFR, Estimated: >60 mL/min (ref >60)
Glucose, Bld: 95 mg/dL (ref 70–99)
Potassium: 3.8 mmol/L (ref 3.5–5.1)
Sodium: 136 mmol/L (ref 135–145)
Total Bilirubin: 0.5 mg/dL (ref 0.3–1.2)
Total Protein: 6.2 g/dL — ABNORMAL LOW (ref 6.5–8.1)

## 2023-01-07 LAB — CBC
HCT: 40.9 % (ref 39.0–52.0)
Hemoglobin: 13.6 g/dL (ref 13.0–17.0)
MCH: 30 pg (ref 26.0–34.0)
MCHC: 33.3 g/dL (ref 30.0–36.0)
MCV: 90.1 fL (ref 80.0–100.0)
Platelets: 255 10*3/uL (ref 150–400)
RBC: 4.54 MIL/uL (ref 4.22–5.81)
RDW: 13.1 % (ref 11.5–15.5)
WBC: 15.8 10*3/uL — ABNORMAL HIGH (ref 4.0–10.5)
nRBC: 0 % (ref 0.0–0.2)

## 2023-01-07 LAB — TROPONIN I (HIGH SENSITIVITY)
Troponin I (High Sensitivity): 28 ng/L — ABNORMAL HIGH (ref <18)
Troponin I (High Sensitivity): 26 ng/L — ABNORMAL HIGH (ref <18)

## 2023-01-07 LAB — HIV ANTIBODY (ROUTINE TESTING W REFLEX): HIV Screen 4th Generation wRfx: NONREACTIVE

## 2023-01-07 LAB — LIPID PANEL
Cholesterol: 219 mg/dL — ABNORMAL HIGH (ref 0–200)
HDL: 31 mg/dL — ABNORMAL LOW (ref >40)
LDL Cholesterol: 173 mg/dL — ABNORMAL HIGH (ref 0–99)
Total CHOL/HDL Ratio: 7.1 ratio
Triglycerides: 73 mg/dL (ref <150)
VLDL: 15 mg/dL (ref 0–40)

## 2023-01-07 MED ORDER — ASPIRIN 81 MG PO CHEW: 81.0000 mg | CHEWABLE_TABLET | Freq: Every day | ORAL | Status: DC

## 2023-01-07 MED ORDER — BISOPROLOL FUMARATE 5 MG PO TABS
2.5000 mg | ORAL_TABLET | Freq: Every day | ORAL | Status: DC
  Administered 2023-01-07: 2.5 mg via ORAL
  Filled 2023-01-07: qty 1

## 2023-01-07 MED ORDER — ROSUVASTATIN CALCIUM 20 MG PO TABS
20.0000 mg | ORAL_TABLET | Freq: Every day | ORAL | Status: DC
  Administered 2023-01-07: 20 mg via ORAL
  Filled 2023-01-07: qty 1

## 2023-01-07 MED ORDER — ROSUVASTATIN CALCIUM 20 MG PO TABS: 20.0000 mg | ORAL_TABLET | Freq: Every day | ORAL | 0 refills | Status: AC

## 2023-01-07 MED ORDER — ALBUTEROL SULFATE (2.5 MG/3ML) 0.083% IN NEBU: 3.0000 mL | INHALATION_SOLUTION | RESPIRATORY_TRACT | Status: DC | PRN

## 2023-01-07 MED ORDER — ATORVASTATIN CALCIUM 40 MG PO TABS: 40.0000 mg | ORAL_TABLET | Freq: Every day | ORAL | Status: DC

## 2023-01-07 MED ORDER — ASPIRIN 81 MG PO CHEW
81.0000 mg | CHEWABLE_TABLET | Freq: Every day | ORAL | Status: DC
  Administered 2023-01-07 (×2): 81 mg via ORAL
  Filled 2023-01-07 (×2): qty 1

## 2023-01-07 MED ORDER — ENOXAPARIN SODIUM 60 MG/0.6ML IJ SOSY
55.0000 mg | PREFILLED_SYRINGE | INTRAMUSCULAR | Status: DC
  Filled 2023-01-07: qty 0.6

## 2023-01-07 MED ORDER — SODIUM CHLORIDE 0.9% FLUSH
3.0000 mL | Freq: Two times a day (BID) | INTRAVENOUS | Status: DC
  Administered 2023-01-07: 3 mL via INTRAVENOUS

## 2023-01-07 MED ORDER — LACTATED RINGERS IV SOLN: INTRAVENOUS | Status: DC

## 2023-01-07 MED ORDER — ASPIRIN 81 MG PO CHEW: 81.0000 mg | CHEWABLE_TABLET | Freq: Every day | ORAL | 0 refills | Status: AC

## 2023-01-07 MED ORDER — MONTELUKAST SODIUM 10 MG PO TABS: 10.0000 mg | ORAL_TABLET | Freq: Every day | ORAL | Status: DC

## 2023-01-07 MED ORDER — MORPHINE SULFATE (PF) 2 MG/ML IV SOLN: 1.0000 mg | INTRAVENOUS | Status: DC | PRN

## 2023-01-07 MED ORDER — SODIUM CHLORIDE 0.9 % IV SOLN: 250.0000 mL | INTRAVENOUS | Status: DC | PRN

## 2023-01-07 MED ORDER — SODIUM CHLORIDE 0.9% FLUSH: 3.0000 mL | INTRAVENOUS | Status: DC | PRN

## 2023-01-07 MED ORDER — SENNOSIDES-DOCUSATE SODIUM 8.6-50 MG PO TABS: 1.0000 | ORAL_TABLET | Freq: Every evening | ORAL | Status: DC | PRN

## 2023-01-07 MED ORDER — ISOSORBIDE MONONITRATE ER 30 MG PO TB24
15.0000 mg | ORAL_TABLET | Freq: Every day | ORAL | Status: DC
  Administered 2023-01-07: 15 mg via ORAL
  Filled 2023-01-07: qty 1

## 2023-01-07 MED ORDER — ENOXAPARIN SODIUM 30 MG/0.3ML IJ SOSY: 30.0000 mg | PREFILLED_SYRINGE | INTRAMUSCULAR | Status: DC

## 2023-01-07 MED ORDER — AMOXICILLIN-POT CLAVULANATE 875-125 MG PO TABS: 1.0000 | ORAL_TABLET | Freq: Two times a day (BID) | ORAL | 0 refills | Status: AC

## 2023-01-07 NOTE — ED Notes (Signed)
ED TO INPATIENT HANDOFF REPORT  ED Nurse Name and Phone #: Theadora Rama RN [phone]  S Name/Age/Gender Angel Gutierrez 52 y.o. male Room/Bed: 026C/026C  Code Status   Code Status: Full Code  Home/SNF/Other Home Patient oriented to: self, place, time, and situation Is this baseline? Yes   Triage Complete: Triage complete  Chief Complaint Unstable angina (HCC) [I20.0]  Triage Note Patient BIB [facility] from home for sudden onset of 9/10 CP radiating to left arm, jaw and back. Patient had near syncopal episode and called for help to family who called 911. On EMS arrival patient BP was 200/100 and improved with administration of 3 doses of nitroglycerin to 179/86. Patient CP improved to 7/10 as well. Patient presents to ED pale, diaphoretic, and in obvious discomfort. A&Ox4   Allergies Allergies  Allergen Reactions   Dust Mite Extract Anaphylaxis and Rash   Egg-Derived Products Anaphylaxis   Influenza Vaccines     ALLERGY TO EGGS   Metoprolol Shortness Of Breath    Mild increase in SOB related to asthma   Vicodin [Hydrocodone-Acetaminophen] Nausea And Vomiting    Level of Care/Admitting Diagnosis ED Disposition     ED Disposition  Admit   Condition  --   Comment  Hospital Area: [facility] [[id number]]  Level of Care: Telemetry Cardiac [[id number]]  May admit patient to [facility] or [facility] if equivalent level of care is available:: No  Covid Evaluation: Asymptomatic - no recent exposure (last 10 days) testing not required  Diagnosis: Unstable angina [facility]) [[id number]]  Admitting Physician: Tereasa Coop [[id number]]  Attending Physician: Tereasa Coop [[id number]]  Certification:: I certify this patient will need inpatient services for at least 2 midnights  Expected Medical Readiness: 01/11/2023          B Medical/Surgery History Past Medical History:  Diagnosis Date   Alcoholism (HCC)    Allergy    Anxiety    Asthma    Attention and concentration deficit     CHF (congestive heart failure) (HCC)    Coronary artery disease    a.  s/p BMS to LCx in (2012); DES to RCA on 01/27/14   Depression    Emphysema of lung (HCC)    GERD (gastroesophageal reflux disease)    Hyperlipidemia    Hypertension    Myocardial infarction (HCC) 10/12/2010   Past Surgical History:  Procedure Laterality Date   CARDIAC CATHETERIZATION  01/04/2015   Patent stents in the RCA and CFX, LAD 35%, preserved LV function   CARDIAC CATHETERIZATION N/A 01/04/2015   Procedure: Left Heart Cath and Coronary Angiography;  Surgeon: Runell Gess, MD;  Location: [facility] INVASIVE CV LAB;  Service: Cardiovascular;  Laterality: N/A;   CARDIAC CATHETERIZATION     CORONARY STENT PLACEMENT  09/23/2010   BMS to the CFX   FASCIOTOMY Right 01/04/2015   Procedure: Left Forearm Fasciotomy;  Surgeon: Sherren Kerns, MD;  Location: [facility] OR;  Service: Vascular;  Laterality: Right;   FASCIOTOMY CLOSURE Right 01/06/2015   Procedure: RIGHT ARM FASCIOTOMY CLOSURE;  Surgeon: Sherren Kerns, MD;  Location: [facility] OR;  Service: Vascular;  Laterality: Right;   LEFT HEART CATHETERIZATION WITH CORONARY ANGIOGRAM N/A 01/27/2014   Procedure: LEFT HEART CATHETERIZATION WITH CORONARY ANGIOGRAM;  Surgeon: Peter M Swaziland, MD;  Location: [facility] CATH LAB;  Service: Cardiovascular;  Laterality: N/A;   LEFT HEART CATHETERIZATION WITH CORONARY ANGIOGRAM N/A 01/30/2014   Procedure: LEFT HEART CATHETERIZATION WITH CORONARY ANGIOGRAM;  Surgeon: Corky Crafts, MD;  Location: [facility] CATH LAB;  Service: Cardiovascular;  Laterality: N/A;   PERCUTANEOUS CORONARY STENT INTERVENTION (PCI-S)  01/27/2014   Procedure: PERCUTANEOUS CORONARY STENT INTERVENTION (PCI-S);  Surgeon: Peter M Swaziland, MD;  Location: [facility] CATH LAB;  Service: Cardiovascular;;   REPLANTATION THUMB Left    WISDOM TOOTH EXTRACTION       A IV Location/Drains/Wounds Patient Lines/Drains/Airways Status     Active Line/Drains/Airways     Name Placement date  Placement time Site Days   Peripheral IV 01/06/23 18 G Right Antecubital 01/06/23  1829  Antecubital  1            Intake/Output Last 24 hours No intake or output data in the 24 hours ending 01/07/23 0052  Labs/Imaging Results for orders placed or performed during the hospital encounter of 01/06/23 (from the past 48 hour(s))  CBG monitoring, ED     Status: Abnormal   Collection Time: 01/06/23  7:06 PM  Result Value Ref Range   Glucose-Capillary 112 (H) 70 - 99 mg/dL    Comment: Glucose reference range applies only to samples taken after fasting for at least 8 hours.  CBC with Differential/Platelet     Status: None   Collection Time: 01/06/23  7:48 PM  Result Value Ref Range   WBC 9.7 4.0 - 10.5 K/uL   RBC 4.74 4.22 - 5.81 MIL/uL   Hemoglobin 14.0 13.0 - 17.0 g/dL   HCT 78.2 95.6 - 21.3 %   MCV 90.5 80.0 - 100.0 fL   MCH 29.5 26.0 - 34.0 pg   MCHC 32.6 30.0 - 36.0 g/dL   RDW 08.6 57.8 - 46.9 %   Platelets 278 150 - 400 K/uL   nRBC 0.0 0.0 - 0.2 %   Neutrophils Relative % 69 %   Neutro Abs 6.7 1.7 - 7.7 K/uL   Lymphocytes Relative 21 %   Lymphs Abs 2.0 0.7 - 4.0 K/uL   Monocytes Relative 8 %   Monocytes Absolute 0.8 0.1 - 1.0 K/uL   Eosinophils Relative 1 %   Eosinophils Absolute 0.1 0.0 - 0.5 K/uL   Basophils Relative 1 %   Basophils Absolute 0.1 0.0 - 0.1 K/uL   Immature Granulocytes 0 %   Abs Immature Granulocytes 0.03 0.00 - 0.07 K/uL    Comment: Performed at [facility], [address] [place], [place] [address]  Comprehensive metabolic panel     Status: Abnormal   Collection Time: 01/06/23  7:48 PM  Result Value Ref Range   Sodium 137 135 - 145 mmol/L   Potassium 4.5 3.5 - 5.1 mmol/L    Comment: HEMOLYSIS AT THIS LEVEL MAY AFFECT RESULT   Chloride 103 98 - 111 mmol/L   CO2 21 (L) 22 - 32 mmol/L   Glucose, Bld 112 (H) 70 - 99 mg/dL    Comment: Glucose reference range applies only to samples taken after fasting for at least 8 hours.   BUN 10 6 - 20  mg/dL   Creatinine, Ser 8.41 (H) 0.61 - 1.24 mg/dL   Calcium 8.9 8.9 - 32.4 mg/dL   Total Protein 6.3 (L) 6.5 - 8.1 g/dL   Albumin 3.5 3.5 - 5.0 g/dL   AST 20 15 - 41 U/L    Comment: HEMOLYSIS AT THIS LEVEL MAY AFFECT RESULT   ALT 16 0 - 44 U/L    Comment: HEMOLYSIS AT THIS LEVEL MAY AFFECT RESULT   Alkaline Phosphatase 55 38 - 126 U/L   Total Bilirubin 0.9  0.3 - 1.2 mg/dL    Comment: HEMOLYSIS AT THIS LEVEL MAY AFFECT RESULT   GFR, Estimated 56 (L) >60 mL/min    Comment: (NOTE) Calculated using the CKD-EPI Creatinine Equation (2021)    Anion gap 13 5 - 15    Comment: Performed at [facility], [address], [place], [place] [address]  Troponin I (High Sensitivity)     Status: None   Collection Time: 01/06/23  7:48 PM  Result Value Ref Range   Troponin I (High Sensitivity) 6 <18 ng/L    Comment: (NOTE) Elevated high sensitivity troponin I (hsTnI) values and significant  changes across serial measurements may suggest ACS but many other  chronic and acute conditions are known to elevate hsTnI results.  Refer to the "Links" section for chest pain algorithms and additional  guidance. Performed at [facility], [address], [place], [place] [address]   I-stat chem 8, ED (not at [facility], [facility] or [facility])     Status: Abnormal   Collection Time: 01/06/23  8:23 PM  Result Value Ref Range   Sodium 137 135 - 145 mmol/L   Potassium 4.3 3.5 - 5.1 mmol/L   Chloride 103 98 - 111 mmol/L   BUN 9 6 - 20 mg/dL   Creatinine, Ser 7.32 0.61 - 1.24 mg/dL   Glucose, Bld 98 70 - 99 mg/dL    Comment: Glucose reference range applies only to samples taken after fasting for at least 8 hours.   Calcium, Ion 1.11 (L) 1.15 - 1.40 mmol/L   TCO2 22 22 - 32 mmol/L   Hemoglobin 14.3 13.0 - 17.0 g/dL   HCT 20.2 54.2 - 70.6 %  Troponin I (High Sensitivity)     Status: None   Collection Time: 01/06/23  9:45 PM  Result Value Ref Range   Troponin I (High Sensitivity) 13 <18 ng/L    Comment:  (NOTE) Elevated high sensitivity troponin I (hsTnI) values and significant  changes across serial measurements may suggest ACS but many other  chronic and acute conditions are known to elevate hsTnI results.  Refer to the "Links" section for chest pain algorithms and additional  guidance. Performed at [facility], [address] [place], [place] [address]    CT HEAD WO CONTRAST ( )  Result Date: 01/07/2023 CLINICAL DATA:  Syncope/presyncope EXAM: CT HEAD WITHOUT CONTRAST TECHNIQUE: Contiguous axial images were obtained from the base of the skull through the vertex without intravenous contrast. RADIATION DOSE REDUCTION: This exam was performed according to the departmental dose-optimization program which includes automated exposure control, adjustment of the mA and/or kV according to patient size and/or use of iterative reconstruction technique. COMPARISON:  None Available. FINDINGS: Brain: No intracranial hemorrhage, mass effect, or evidence of acute infarct. No hydrocephalus. No extra-axial fluid collection. Vascular: No hyperdense vessel or unexpected calcification. Skull: No fracture or focal lesion. Sinuses/Orbits: No acute finding. Other: None. IMPRESSION: No acute intracranial abnormality. Electronically Signed   By: Minerva Fester M.D.   On: 01/07/2023 00:47   CT Angio Chest/Abd/Pel for Dissection W and/or Wo Contrast  Result Date: 01/06/2023 CLINICAL DATA:  Per EMS run sheet- chest pain, anxiety worries, dizziness and giddiness, shortness of breath EXAM: CT ANGIOGRAPHY CHEST, ABDOMEN AND PELVIS TECHNIQUE: Non-contrast CT of the chest was initially obtained. Multidetector CT imaging through the chest, abdomen and pelvis was performed using the standard protocol during bolus administration of intravenous contrast. Multiplanar reconstructed images and MIPs were obtained and reviewed to  evaluate the vascular anatomy. RADIATION DOSE REDUCTION: This exam was performed according to the  departmental dose-optimization program which includes automated exposure control, adjustment of the mA and/or kV according to patient size and/or use of iterative reconstruction technique. CONTRAST:  OMNIPAQUE IOHEXOL 350 MG/ML SOLN COMPARISON:  Chest radiograph 01/04/2015 FINDINGS: CTA CHEST FINDINGS Cardiovascular: Coronary artery and aortic atherosclerotic calcification. No pericardial effusion. No aortic aneurysm or dissection. Mediastinum/Nodes: No thoracic adenopathy by size. Unremarkable trachea and esophagus. Lungs/Pleura: Mild air trapping. No focal consolidation, pleural effusion, or pneumothorax. Musculoskeletal: No acute fracture. Review of the MIP images confirms the above findings. CTA ABDOMEN AND PELVIS FINDINGS VASCULAR Aorta: Advanced mixed density atherosclerotic plaque throughout the abdominal aorta without aneurysm or dissection. There is mild narrowing of the infrarenal abdominal. Celiac: Normal variant configuration of the celiac axis with the left gastric and splenic arteries arising at the aorta. No aneurysm or dissection. SMA: Patent without aneurysm or dissection. Renals: Patent without aneurysm or dissection. Accessory left renal artery. IMA: Patent. Inflow: Scattered atherosclerotic plaque without severe stenosis. No aneurysm or dissection. Veins: No obvious venous abnormality within the limitations of this arterial phase study. Review of the MIP images confirms the above findings. NON-VASCULAR Hepatobiliary: Hepatic steatosis. Unremarkable gallbladder and biliary tree. Pancreas: Unremarkable. Spleen: Unremarkable. Adrenals/Urinary Tract: Unremarkable adrenal glands. No urinary calculi or hydronephrosis. Unremarkable bladder. Stomach/Bowel: Normal caliber large and small bowel. Sigmoid diverticulosis. There is mild wall thickening and trace adjacent stranding about the sigmoid colon. No free air or abscess. Normal appendix and stomach. Lymphatic: No enlarged lymph nodes.  Reproductive: Unremarkable. Other: Small fat containing umbilical hernia. Small fat containing left inguinal hernia. Musculoskeletal: No acute fracture. Review of the MIP images confirms the above findings. IMPRESSION: 1. No aortic aneurysm or dissection. 2. Mild uncomplicated sigmoid diverticulitis. Consider follow-up colonoscopy following resolution of patient's acute symptoms to exclude underlying mass. 3. Hepatic steatosis. 4. Mild air trapping in the lungs which can be seen with constrictive bronchiolitis. Aortic Atherosclerosis (ICD10-I70.0). Electronically Signed   By: Minerva Fester M.D.   On: 01/06/2023 19:53    Pending Labs Unresulted Labs (From admission, onward)     Start     Ordered   01/07/23 0500  Lipid panel  Tomorrow morning,   R        01/07/23 0038   01/07/23 0500  Hemoglobin A1c  Tomorrow morning,   R        01/07/23 0038   01/07/23 0500  Comprehensive metabolic panel  Tomorrow morning,   R        01/07/23 0038   01/07/23 0500  CBC  Tomorrow morning,   R        01/07/23 0038   01/07/23 0039  HIV Antibody (routine testing w rflx)  (HIV Antibody (Routine testing w reflex) panel)  Once,   R        01/07/23 0038   01/07/23 0039  Gastrointestinal Panel by PCR , Stool  (Gastrointestinal Panel by PCR, Stool                                                                                                                                                     **  Does Not include CLOSTRIDIUM DIFFICILE testing. **If CDIFF testing is needed, place order from the "C Difficile Testing" order set.**)  Once,   R       Question Answer Comment  Patient immune status Normal   Release to patient Immediate      01/07/23 0038            Vitals/Pain Today's Vitals   01/06/23 2100 01/06/23 2130 01/06/23 2200 01/06/23 2230  BP: 99/65 105/61 107/65 112/68  Pulse: 87 84 77 70  Resp: 18 (!) 23 (!) 23 10  Temp:      TempSrc:      SpO2: 100% 100% 100% 100%  PainSc:        Isolation  Precautions No active isolations  Medications Medications  isosorbide mononitrate (IMDUR) 24 hr tablet 15 mg (has no administration in time range)  albuterol (PROVENTIL) (2.5 MG/3ML) 0.083% nebulizer solution 3 mL (has no administration in time range)  montelukast (SINGULAIR) tablet 10 mg (has no administration in time range)  lactated ringers infusion (has no administration in time range)  atorvastatin (LIPITOR) tablet 40 mg (has no administration in time range)  sodium chloride flush (NS) 0.9 % injection 3 mL (has no administration in time range)  sodium chloride flush (NS) 0.9 % injection 3 mL (has no administration in time range)  0.9 %  sodium chloride infusion (has no administration in time range)  morphine (PF) 2 MG/ML injection 1 mg (has no administration in time range)  senna-docusate (Senokot-S) tablet 1 tablet (has no administration in time range)  aspirin chewable tablet 81 mg (has no administration in time range)  lactated ringers bolus 1,000 mL (0 mLs Intravenous Stopped 01/06/23 1915)  iohexol (OMNIPAQUE) 350 MG/ML injection 100 mL (100 mLs Intravenous Contrast Given 01/06/23 1934)    Mobility walks     Focused Assessments Cardiac Assessment Handoff:  Cardiac Rhythm: Normal sinus rhythm Lab Results  Component Value Date   CKTOTAL 186 10/13/2010   CKMB 13.7 (HH) 10/13/2010   TROPONINI <0.03 01/02/2015   No results found for: "DDIMER" Does the Patient currently have chest pain? No   , Neuro Assessment Handoff:  Swallow screen pass? Yes  Cardiac Rhythm: Normal sinus rhythm       Neuro Assessment: Within Defined Limits Neuro Checks:      Has TPA been given? No If patient is a Neuro Trauma and patient is going to OR before floor call report to 4N Charge nurse: [phone] or [phone]  , Pulmonary Assessment Handoff:  Lung sounds:   O2 Device: Nasal Cannula O2 Flow Rate (L/min): 2 L/min    R Recommendations: See Admitting Provider Note  Report  given to:   Additional Notes:

## 2023-01-07 NOTE — Discharge Summary (Addendum)
Physician Discharge Summary  Angel Gutierrez WJX:[id number] DOB: 09/13/1970 DOA: 01/06/2023  PCP: Royann Shivers, PA-C  Admit date: 01/06/2023 Discharge date: 01/07/2023  Admitted From: home Disposition:  home  Recommendations for Outpatient Follow-up:  Follow up with PCP in 1-2 weeks Cardiology will arrange follow-up for outpatient stress test  Home Health: none Equipment/Devices: none  Discharge Condition: stable CODE STATUS: Full code Diet Orders (From admission, onward)     Start     Ordered   01/07/23 0854  Diet Heart Room service appropriate? Yes; Fluid consistency: Thin  Diet effective now       Question Answer Comment  Room service appropriate? Yes   Fluid consistency: Thin      01/07/23 0853           HPI: Per admitting MD, Angel Gutierrez is a 52 y.o. male with medical history significant of CAD status post bare-metal stent mid RCA 2012, last heart cath in 2021 which showed all stents are widely patent, hypertension, hyperlipidemia and unstable angina scented to emergency department via EMS for evaluation for chest pain. Per chart review of EMS and ED physician note, Patient reports that he was working in his wood shop and more itching the game when he suddenly developed intense pain in the center of his chest that was radiating down into his left arm and up into his jaw.  He checked his blood pressure at the time and it was elevated at 160 systolic.  He took nitro at home and the pain continued to worsen and blood pressure went up to 190.  When EMS arrived patient was still having active pain which she reports went straight through the middle and down his arm and he could even feel it in his throat.  He denied any pain in his abdomen.  He was given nitro and aspirin by EMS and when he presented here he reports the pain is still about a 6 out of 10.  He then suddenly had decreased mental status, sweating and paleness and blood pressure dropped into the 60s.  Patient was  placed in reverse Trendelenburg and a fluid bolus was started.  He quickly regained consciousness and reported he was still having chest pain but it was starting to improve.  He denies any nausea or vomiting.  He has not had any recent surgeries or immobilization.  He has had no unilateral leg pain or swelling.  He reports this feels similar when he had his last heart attack.  Hospital Course / Discharge diagnoses: Principal Problem:   Unstable angina (HCC) Active Problems:   Essential hypertension   Hyperlipidemia   History of CAD (coronary artery disease)   Postural dizziness with presyncope   AKI (acute kidney injury) (HCC)   Hypotensive episode   Metabolic acidosis   Sigmoid diverticulitis   Reactive airway disease   Principal problem Chest pain, history of CAD with prior PCI's -patient was admitted to the hospital with chest pain which is similar to his prior MIs.  He also had an episode of hypotension and syncope after getting his home nitroglycerin.  Chest pain has resolved with nitroglycerin.  Cardiology was consulted and patient was admitted to the hospital.  He underwent serial troponin checks which are flat, not characteristic for ACS.  A 2D echo was fairly unremarkable with normal EF and grade 1 diastolic dysfunction.  RV was normal.  Cardiology cleared patient for discharge, will be sent home in stable condition, and will have stress test as  an outpatient.  Active problems AKI -creatinine 1.5 on admission, normalized upon discharge with fluids. 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 Given some symptoms, it may have been caught incidentally early, will treat with Augmentin  Sepsis ruled out   Discharge Instructions   Allergies as of 01/07/2023       Reactions   Dust Mite Extract Anaphylaxis, Rash   Egg-derived  Products Anaphylaxis   Influenza Vaccines    ALLERGY TO EGGS   Metoprolol Shortness Of Breath   Mild increase in SOB related to asthma   Vicodin [hydrocodone-acetaminophen] Nausea And Vomiting        Medication List     TAKE these medications    albuterol 108 (90 Base) MCG/ACT inhaler Commonly known as: VENTOLIN HFA Inhale 2 puffs into the lungs every 4 (four) hours as needed for wheezing or shortness of breath.   amoxicillin-clavulanate 875-125 MG tablet Commonly known as: AUGMENTIN Take 1 tablet by mouth 2 (two) times daily for 7 days.   aspirin 81 MG chewable tablet Chew 1 tablet (81 mg total) by mouth daily. Start taking on: January 08, 2023   bisoprolol 5 MG tablet Commonly known as: ZEBETA Take 0.5 tablets (2.5 mg total) by mouth daily.   EPINEPHrine 0.3 mg/0.3 mL Soaj injection Commonly known as: EPI-PEN Inject 0.3 mLs (0.3 mg total) into the muscle once.   isosorbide mononitrate 30 MG 24 hr tablet Commonly known as: IMDUR Take 0.5 tablets (15 mg total) by mouth daily.   montelukast 10 MG tablet Commonly known as: SINGULAIR Take 10 mg by mouth daily in the afternoon.   nitroGLYCERIN 0.4 MG SL tablet Commonly known as: NITROSTAT Place 0.4 mg under the tongue every 5 (five) minutes as needed for chest pain.   rosuvastatin 20 MG tablet Commonly known as: CRESTOR Take 1 tablet (20 mg total) by mouth daily. Start taking on: January 08, 2023        Follow-up Information     Azalee Course, [place] Follow up.   Specialties: Cardiology, Radiology Why: Cardiology Hospital Follow-up on 01/19/2023 at 8:25 AM. Please contact the office if needing a different date or time. Contact information: [address] [place] [address] [phone]                 Consultations: Cardiology   Procedures/Studies:  ECHOCARDIOGRAM COMPLETE  Result Date: 01/07/2023    ECHOCARDIOGRAM REPORT   Patient Name:   Angel Gutierrez Date of Exam: 01/07/2023  Medical Rec #:  [id number]    Height:       72.0 in Accession #:    [id number]   Weight:       251.5 lb Date of Birth:  [birth year]    BSA:          2.348 m Patient Age:    52 years     BP:           141/79 mmHg Patient Gender: M            HR:           53 bpm. Exam Location:  Inpatient Procedure: 2D Echo, Color Doppler and Cardiac Doppler Indications:    Chest Pain  History:        Patient has no prior history of Echocardiogram examinations. CAD  and Previous PCI, Signs/Symptoms:Dizziness/Lightheadedness; Risk                 Factors:Hypertension and Dyslipidemia.  Sonographer:    Milbert Coulter Referring Phys: Tereasa Coop IMPRESSIONS  1. Left ventricular ejection fraction, by estimation, is 55 to 60%. The left ventricle has normal function. The left ventricle has no regional wall motion abnormalities. Left ventricular diastolic parameters are consistent with Grade I diastolic dysfunction (impaired relaxation).  2. Right ventricular systolic function is normal. The right ventricular size is normal.  3. The mitral valve is normal in structure. No evidence of mitral valve regurgitation. No evidence of mitral stenosis.  4. The aortic valve is normal in structure. Aortic valve regurgitation is not visualized. No aortic stenosis is present.  5. The inferior vena cava is normal in size with greater than 50% respiratory variability, suggesting right atrial pressure of 3 mmHg. FINDINGS  Left Ventricle: Left ventricular ejection fraction, by estimation, is 55 to 60%. The left ventricle has normal function. The left ventricle has no regional wall motion abnormalities. The left ventricular internal cavity size was normal in size. There is  no left ventricular hypertrophy. Left ventricular diastolic parameters are consistent with Grade I diastolic dysfunction (impaired relaxation). Right Ventricle: The right ventricular size is normal. No increase in right ventricular wall thickness. Right ventricular systolic  function is normal. Left Atrium: Left atrial size was normal in size. Right Atrium: Right atrial size was normal in size. Pericardium: There is no evidence of pericardial effusion. Presence of epicardial fat layer. Mitral Valve: The mitral valve is normal in structure. No evidence of mitral valve regurgitation. No evidence of mitral valve stenosis. Tricuspid Valve: The tricuspid valve is normal in structure. Tricuspid valve regurgitation is not demonstrated. No evidence of tricuspid stenosis. Aortic Valve: The aortic valve is normal in structure. Aortic valve regurgitation is not visualized. No aortic stenosis is present. Aortic valve mean gradient measures 3.0 mmHg. Aortic valve peak gradient measures 6.5 mmHg. Aortic valve area, by VTI measures 3.33 cm. Pulmonic Valve: The pulmonic valve was normal in structure. Pulmonic valve regurgitation is trivial. No evidence of pulmonic stenosis. Aorta: The aortic root is normal in size and structure. Venous: The inferior vena cava is normal in size with greater than 50% respiratory variability, suggesting right atrial pressure of 3 mmHg. IAS/Shunts: No atrial level shunt detected by color flow Doppler.  LEFT VENTRICLE PLAX 2D LVIDd:         5.00 cm   Diastology LVIDs:         2.90 cm   LV e' medial:    10.30 cm/s LV PW:         1.10 cm   LV E/e' medial:  8.0 LV IVS:        0.90 cm   LV e' lateral:   16.30 cm/s LVOT diam:     2.40 cm   LV E/e' lateral: 5.0 LV SV:         100 LV SV Index:   42 LVOT Area:     4.52 cm  RIGHT VENTRICLE RV Basal diam:  3.30 cm RV Mid diam:    2.30 cm RV S prime:     12.70 cm/s TAPSE (M-mode): 2.2 cm LEFT ATRIUM             Index        RIGHT ATRIUM           Index LA diam:  4.20 cm 1.79 cm/m   RA Area:     16.90 cm LA Vol (A2C):   44.9 ml 19.12 ml/m  RA Volume:   45.90 ml  19.54 ml/m LA Vol (A4C):   40.5 ml 17.25 ml/m LA Biplane Vol: 42.8 ml 18.22 ml/m  AORTIC VALVE AV Area (Vmax):    3.21 cm AV Area (Vmean):   3.16 cm AV Area  (VTI):     3.33 cm AV Vmax:           127.00 cm/s AV Vmean:          83.400 cm/s AV VTI:            0.299 m AV Peak Grad:      6.5 mmHg AV Mean Grad:      3.0 mmHg LVOT Vmax:         90.10 cm/s LVOT Vmean:        58.200 cm/s LVOT VTI:          0.220 m LVOT/AV VTI ratio: 0.74  AORTA Ao Root diam: 3.20 cm Ao Asc diam:  3.10 cm MITRAL VALVE MV Area (PHT): 3.60 cm    SHUNTS MV Decel Time: 211 msec    Systemic VTI:  0.22 m MV E velocity: 82.30 cm/s  Systemic Diam: 2.40 cm MV A velocity: 68.70 cm/s MV E/A ratio:  1.20 Kardie Tobb DO Electronically signed by Thomasene Ripple DO Signature Date/Time: 01/07/2023/12:08:32 PM    Final    CT HEAD WO CONTRAST ( )  Result Date: 01/07/2023 CLINICAL DATA:  Syncope/presyncope EXAM: CT HEAD WITHOUT CONTRAST TECHNIQUE: Contiguous axial images were obtained from the base of the skull through the vertex without intravenous contrast. RADIATION DOSE REDUCTION: This exam was performed according to the departmental dose-optimization program which includes automated exposure control, adjustment of the mA and/or kV according to patient size and/or use of iterative reconstruction technique. COMPARISON:  None Available. FINDINGS: Brain: No intracranial hemorrhage, mass effect, or evidence of acute infarct. No hydrocephalus. No extra-axial fluid collection. Vascular: No hyperdense vessel or unexpected calcification. Skull: No fracture or focal lesion. Sinuses/Orbits: No acute finding. Other: None. IMPRESSION: No acute intracranial abnormality. Electronically Signed   By: Minerva Fester M.D.   On: 01/07/2023 00:47   CT Angio Chest/Abd/Pel for Dissection W and/or Wo Contrast  Result Date: 01/06/2023 CLINICAL DATA:  Per EMS run sheet- chest pain, anxiety worries, dizziness and giddiness, shortness of breath EXAM: CT ANGIOGRAPHY CHEST, ABDOMEN AND PELVIS TECHNIQUE: Non-contrast CT of the chest was initially obtained. Multidetector CT imaging through the chest, abdomen and pelvis was performed  using the standard protocol during bolus administration of intravenous contrast. Multiplanar reconstructed images and MIPs were obtained and reviewed to evaluate the vascular anatomy. RADIATION DOSE REDUCTION: This exam was performed according to the departmental dose-optimization program which includes automated exposure control, adjustment of the mA and/or kV according to patient size and/or use of iterative reconstruction technique. CONTRAST:  OMNIPAQUE IOHEXOL 350 MG/ML SOLN COMPARISON:  Chest radiograph 01/04/2015 FINDINGS: CTA CHEST FINDINGS Cardiovascular: Coronary artery and aortic atherosclerotic calcification. No pericardial effusion. No aortic aneurysm or dissection. Mediastinum/Nodes: No thoracic adenopathy by size. Unremarkable trachea and esophagus. Lungs/Pleura: Mild air trapping. No focal consolidation, pleural effusion, or pneumothorax. Musculoskeletal: No acute fracture. Review of the MIP images confirms the above findings. CTA ABDOMEN AND PELVIS FINDINGS VASCULAR Aorta: Advanced mixed density atherosclerotic plaque throughout the abdominal aorta without aneurysm or dissection. There is mild narrowing of the infrarenal abdominal. Celiac: Normal  variant configuration of the celiac axis with the left gastric and splenic arteries arising at the aorta. No aneurysm or dissection. SMA: Patent without aneurysm or dissection. Renals: Patent without aneurysm or dissection. Accessory left renal artery. IMA: Patent. Inflow: Scattered atherosclerotic plaque without severe stenosis. No aneurysm or dissection. Veins: No obvious venous abnormality within the limitations of this arterial phase study. Review of the MIP images confirms the above findings. NON-VASCULAR Hepatobiliary: Hepatic steatosis. Unremarkable gallbladder and biliary tree. Pancreas: Unremarkable. Spleen: Unremarkable. Adrenals/Urinary Tract: Unremarkable adrenal glands. No urinary calculi or hydronephrosis. Unremarkable bladder.  Stomach/Bowel: Normal caliber large and small bowel. Sigmoid diverticulosis. There is mild wall thickening and trace adjacent stranding about the sigmoid colon. No free air or abscess. Normal appendix and stomach. Lymphatic: No enlarged lymph nodes. Reproductive: Unremarkable. Other: Small fat containing umbilical hernia. Small fat containing left inguinal hernia. Musculoskeletal: No acute fracture. Review of the MIP images confirms the above findings. IMPRESSION: 1. No aortic aneurysm or dissection. 2. Mild uncomplicated sigmoid diverticulitis. Consider follow-up colonoscopy following resolution of patient's acute symptoms to exclude underlying mass. 3. Hepatic steatosis. 4. Mild air trapping in the lungs which can be seen with constrictive bronchiolitis. Aortic Atherosclerosis (ICD10-I70.0). Electronically Signed   By: Minerva Fester M.D.   On: 01/06/2023 19:53     Subjective: - no chest pain, shortness of breath, no abdominal pain, nausea or vomiting.   Discharge Exam: BP (!) 141/79 (BP Location: Left Arm)   Pulse 70   Temp 98.4 F (36.9 C) (Oral)   Resp 18   Ht 6' (1.829 m)   Wt 114.1 kg   SpO2 95%   BMI 34.11 kg/m   General: Pt is alert, awake, not in acute distress Cardiovascular: RRR, S1/S2 +, no rubs, no gallops Respiratory: CTA bilaterally, no wheezing, no rhonchi Abdominal: Soft, NT, ND, bowel sounds + Extremities: no edema, no cyanosis    The results of significant diagnostics from this hospitalization (including imaging, microbiology, ancillary and laboratory) are listed below for reference.     Microbiology: No results found for this or any previous visit (from the past 240 hour(s)).   Labs: Basic Metabolic Panel: Recent Labs  Lab 01/06/23 1948 01/06/23 2023 01/07/23 0226  NA 137 137 136  K 4.5 4.3 3.8  CL 103 103 104  CO2 21*  --  23  GLUCOSE 112* 98 95  BUN 10 9 8   CREATININE 1.51* 1.20 1.10  CALCIUM 8.9  --  8.7*   Liver Function Tests: Recent Labs   Lab 01/06/23 1948 01/07/23 0226  AST 20 12*  ALT 16 16  ALKPHOS 55 55  BILITOT 0.9 0.5  PROT 6.3* 6.2*  ALBUMIN 3.5 3.3*   CBC: Recent Labs  Lab 01/06/23 1948 01/06/23 2023 01/07/23 0226  WBC 9.7  --  15.8*  NEUTROABS 6.7  --   --   HGB 14.0 14.3 13.6  HCT 42.9 42.0 40.9  MCV 90.5  --  90.1  PLT 278  --  255   CBG: Recent Labs  Lab 01/06/23 1906  GLUCAP 112*   Hgb A1c No results for input(s): "HGBA1C" in the last 72 hours. Lipid Profile Recent Labs    01/07/23 0226  CHOL 219*  HDL 31*  LDLCALC 173*  TRIG 73  CHOLHDL 7.1   Thyroid function studies No results for input(s): "TSH", "T4TOTAL", "T3FREE", "THYROIDAB" in the last 72 hours.  Invalid input(s): "FREET3" Urinalysis No results found for: "COLORURINE", "APPEARANCEUR", "LABSPEC", "PHURINE", "GLUCOSEU", "HGBUR", "BILIRUBINUR", "KETONESUR", "  PROTEINUR", "UROBILINOGEN", "NITRITE", "LEUKOCYTESUR"  FURTHER DISCHARGE INSTRUCTIONS:   Get Medicines reviewed and adjusted: Please take all your medications with you for your next visit with your Primary MD   Laboratory/radiological data: Please request your Primary MD to go over all hospital tests and procedure/radiological results at the follow up, please ask your Primary MD to get all Hospital records sent to his/her office.   In some cases, they will be blood work, cultures and biopsy results pending at the time of your discharge. Please request that your primary care M.D. goes through all the records of your hospital data and follows up on these results.   Also Note the following: If you experience worsening of your admission symptoms, develop shortness of breath, life threatening emergency, suicidal or homicidal thoughts you must seek medical attention immediately by calling 911 or calling your MD immediately  if symptoms less severe.   You must read complete instructions/literature along with all the possible adverse reactions/side effects for all the  Medicines you take and that have been prescribed to you. Take any new Medicines after you have completely understood and accpet all the possible adverse reactions/side effects.    Do not drive when taking Pain medications or sleeping medications (Benzodaizepines)   Do not take more than prescribed Pain, Sleep and Anxiety Medications. It is not advisable to combine anxiety,sleep and pain medications without talking with your primary care practitioner   Special Instructions: If you have smoked or chewed Tobacco  in the last 2 yrs please stop smoking, stop any regular Alcohol  and or any Recreational drug use.   Wear Seat belts while driving.   Please note: You were cared for by a hospitalist during your hospital stay. Once you are discharged, your primary care physician will handle any further medical issues. Please note that NO REFILLS for any discharge medications will be authorized once you are discharged, as it is imperative that you return to your primary care physician (or establish a relationship with a primary care physician if you do not have one) for your post hospital discharge needs so that they can reassess your need for medications and monitor your lab values.  Time coordinating discharge: 35 minutes  SIGNED:  Pamella Pert, MD, PhD 01/07/2023, 1:04 PM

## 2023-01-07 NOTE — Discharge Instructions (Addendum)
You have a Stress Test scheduled at [facility]. Your doctor has ordered this test to check the blood flow in your heart arteries.  Please arrive 15 minutes early for paperwork. The whole test will take several hours. You may want to bring reading material to remain occupied while undergoing different parts of the test.  Instructions: No food/drink after midnight the night before. It is OK to take your morning meds with a sip of water EXCEPT for those types of medicines listed below or otherwise instructed. No caffeine/decaf products 24 hours before, including medicines such as Excedrin or Goody Powders. Call if there are any questions.  Wear comfortable clothes and shoes.   Special Medication Instructions: Beta blockers such as metoprolol (Lopressor/Toprol XL), atenolol (Tenormin), carvedilol (Coreg), nebivolol (Bystolic), bisoprolol (Zebeta), propranolol (Inderal) should not be taken for 24 hours before the test. Calcium channel blockers such as diltiazem (Cardizem) or verapmil (Calan) should not be taken for 24 hours before the test. Remove nitroglycerin patches and do not take nitrate preparations such as Imdur/isosorbide the day of your test. No Persantine/Theophylline or Aggrenox medicines should be used within 24 hours of the test.  If you are diabetic, please ask which medications to hold the day of the test.  What To Expect: When you arrive in the lab, the technician will inject a small amount of radioactive tracer into your arm through an IV while you are resting quietly. This helps [place] to form pictures of your heart. You will likely only feel a sting from the IV. After a waiting period, resting pictures will be obtained under a big camera. These are the "before" pictures.  Next, you will be prepped for the stress portion of the test. This may include either walking on a treadmill or receiving a medicine that helps to dilate blood vessels in your heart to simulate the  effect of exercise on your heart. If you are walking on a treadmill, you will walk at different paces to try to get your heart rate to a goal number that is based on your age. If your doctor has chosen the pharmacologic test, then you will receive a medicine through your IV that may cause temporary nausea, flushing, shortness of breath and sometimes chest discomfort or vomiting. This is typically short-lived and usually resolves quickly. If you experience symptoms, that does not automatically mean the test is abnormal. Some patients do not experience any symptoms at all. Your blood pressure and heart rate will be monitored, and we will be watching your EKG on a computer screen for any changes. During this portion of the test, the radiologist will inject another small amount of radioactive tracer into your IV. After a waiting period, you will undergo a second set of pictures. These are the "after" pictures.  The doctor reading the test will compare the before-and-after images to look for evidence of heart blockages or heart weakness. The test usually takes 1 day to complete, but in certain instances (for example, if a patient is over a certain weight limit), the test may be done over the span of 2 days.

## 2023-01-07 NOTE — Progress Notes (Signed)
VASCULAR LAB    Carotid duplex has been performed.  See CV proc for preliminary results.   Jolonda Gomm, RVT 01/07/2023, 11:21 AM

## 2023-01-07 NOTE — Consult Note (Addendum)
Cardiology Consultation   Patient ID: Angel Gutierrez MRN: [id number]; DOB: 07-Oct-1970  Admit date: 01/06/2023 Date of Consult: 01/07/2023  PCP:  Royann Shivers, PA-C    [facility]Care Providers Cardiologist:  Nanetta Batty, MD   {  Patient Profile:   Angel Gutierrez is a 52 y.o. male with a hx of CAD (s/p NSTEMI in 09/2010 with BMS to LCx, cath in 01/2014 with DES to mid-RCA, cath in 12/2014 showing patent stents but complicated by compartment syndrome of RUE), HTN, HLD, asthma and history of tobacco use who is being seen 01/07/2023 for the evaluation of chest pain at the request of Dr. Janalyn Shy.  History of Present Illness:   Angel Gutierrez was last examined by Dr. Allyson Sabal in 02/2021 and denied any recent chest pain but did report progressive dyspnea on exertion which was new for him. It was recommended to obtain a follow-up echocardiogram and Exercise Myoview for further evaluation but it does not appear that these were obtained.  He presented to [facility] ED on 01/06/2023 for evaluation of sudden onset chest pain which radiated to his left arm, jaw and back. He reported an associated near syncopal episode as well and EMS was called and BP was at 200/100 upon their arrival and improved with SL NTG x3. While in the ED, he developed lethargy and diaphoresis and BP was 65/44. He was placed in reverse Trendelenburg and started on fluids with quick improvement. Initial labs showed WBC 9.7, Hgb 14.0, platelets 278, Na+ 137, K+ 4.5 and creatinine 1.51 (no recent values available for comparison was at 0.99 and 2022), albumin 3.5, AST 20 and ALT 16. Initial and repeat Hs Troponin values have been at 6, 13 and 28. FLP shows total cholesterol 219, triglycerides 73, HDL 31 and LDL 173. Hemoglobin A1c pending. EKG showed NSR, HR 98 with no acute ST abnormalities. CTA showed no evidence of aortic aneurysm or dissection. Was noted to have mild uncomplicated sigmoid diverticulitis with follow-up colonoscopy  recommended as an outpatient to exclude underlying mass. Was also noted to have hepatic steatosis and mild air trapping in the lungs which can be seen with constrictive bronchiolitis. Given his reports of dizziness along with intermittent presyncope, a Head CT was also obtained which showed no acute intracranial abnormalities.  He was kept NPO overnight for possible stress testing this morning.  Echocardiogram and carotid dopplers are pending. While his CT Abdomen did show possible sigmoid diverticulitis, he denied any recent GI symptoms it was recommended to hold off on antibiotic therapy for now with GI panel pending.  In talking with the patient today, he reports he is usually very active at baseline and tries to walk 2 miles several days a week for exercise. Says he has lost over 50 pounds within the past year and was on Ozempic temporarily until this was no longer covered by his insurance. Says that he has experienced intermittent episodes of chest pain ever since initial stent placement in 2012 and has stable angina and his pain comes on with activity and typically resolves with rest within a few minutes. This has actually decreased in frequency and severity over the past several years. He did quit smoking and no longer consumes alcohol. Says he did stop statin therapy approximately a year ago due to myalgias and cholesterol has not been rechecked by his PCP. Yesterday, he developed chest pressure while woodworking in his shop and reports this was very intense and resembled his prior MI. He did check his BP  at that time and this was significantly elevated which is unusual for him as his BP is typically well-controlled. Reports the pain lasted for approximately 30 minutes and did improve with nitroglycerin. Had presyncope at home prior to EMS evaluation and again while in the ED in the setting of hypotension. Says his pain basically resolved when he developed hypotension and he denies any recurrent pain  overnight or this morning. No recent orthopnea, PND or pitting edema. Feels back to baseline today and is very anxious to go home. Says he would like to do everything possible to avoid a repeat cardiac catheterization given his complications in the past.   Past Medical History:  Diagnosis Date   Alcoholism (HCC)    Allergy    Anxiety    Asthma    Attention and concentration deficit    CHF (congestive heart failure) (HCC)    Coronary artery disease    a. s/p NSTEMI in 09/2010 with BMS to LCx b. cath in 01/2014 with DES to mid-RCA c. cath in 12/2014 showing patent stents but complicated by compartment syndrome of RUE   Depression    Emphysema of lung (HCC)    GERD (gastroesophageal reflux disease)    Hyperlipidemia    Hypertension    Myocardial infarction (HCC) 10/12/2010    Past Surgical History:  Procedure Laterality Date   CARDIAC CATHETERIZATION  01/04/2015   Patent stents in the RCA and CFX, LAD 35%, preserved LV function   CARDIAC CATHETERIZATION N/A 01/04/2015   Procedure: Left Heart Cath and Coronary Angiography;  Surgeon: Runell Gess, MD;  Location: [facility] INVASIVE CV LAB;  Service: Cardiovascular;  Laterality: N/A;   CARDIAC CATHETERIZATION     CORONARY STENT PLACEMENT  09/23/2010   BMS to the CFX   FASCIOTOMY Right 01/04/2015   Procedure: Left Forearm Fasciotomy;  Surgeon: Sherren Kerns, MD;  Location: [facility] OR;  Service: Vascular;  Laterality: Right;   FASCIOTOMY CLOSURE Right 01/06/2015   Procedure: RIGHT ARM FASCIOTOMY CLOSURE;  Surgeon: Sherren Kerns, MD;  Location: [facility] OR;  Service: Vascular;  Laterality: Right;   LEFT HEART CATHETERIZATION WITH CORONARY ANGIOGRAM N/A 01/27/2014   Procedure: LEFT HEART CATHETERIZATION WITH CORONARY ANGIOGRAM;  Surgeon: Peter M Swaziland, MD;  Location: [facility] CATH LAB;  Service: Cardiovascular;  Laterality: N/A;   LEFT HEART CATHETERIZATION WITH CORONARY ANGIOGRAM N/A 01/30/2014   Procedure: LEFT HEART CATHETERIZATION WITH CORONARY  ANGIOGRAM;  Surgeon: Corky Crafts, MD;  Location: [facility] CATH LAB;  Service: Cardiovascular;  Laterality: N/A;   PERCUTANEOUS CORONARY STENT INTERVENTION (PCI-S)  01/27/2014   Procedure: PERCUTANEOUS CORONARY STENT INTERVENTION (PCI-S);  Surgeon: Peter M Swaziland, MD;  Location: [facility] CATH LAB;  Service: Cardiovascular;;   REPLANTATION THUMB Left    WISDOM TOOTH EXTRACTION       Home Medications:  Prior to Admission medications   Medication Sig Start Date End Date Taking? Authorizing Provider  albuterol (PROVENTIL HFA;VENTOLIN HFA) 108 (90 BASE) MCG/ACT inhaler Inhale 2 puffs into the lungs every 4 (four) hours as needed for wheezing or shortness of breath.   Yes [provider]  bisoprolol (ZEBETA) 5 MG tablet Take 0.5 tablets (2.5 mg total) by mouth daily. 01/28/14  Yes Azalee Course, PA  EPINEPHrine 0.3 mg/0.3 mL IJ SOAJ injection Inject 0.3 mLs (0.3 mg total) into the muscle once. 04/18/15  Yes Lyndal Pulley, MD  isosorbide mononitrate (IMDUR) 30 MG 24 hr tablet Take 0.5 tablets (15 mg total) by mouth daily. 01/26/15  Yes Barrett, Bjorn Loser  G, PA-C  montelukast (SINGULAIR) 10 MG tablet Take 10 mg by mouth daily in the afternoon. 02/15/21  Yes [provider]  nitroGLYCERIN (NITROSTAT) 0.4 MG SL tablet Place 0.4 mg under the tongue every 5 (five) minutes as needed for chest pain.   Yes [provider]    Inpatient Medications: Scheduled Meds:  aspirin  81 mg Oral Daily   atorvastatin  40 mg Oral Daily   enoxaparin (LOVENOX) injection  30 mg Subcutaneous Q24H   isosorbide mononitrate  15 mg Oral Daily   [START ON 01/08/2023] montelukast  10 mg Oral Q1500   sodium chloride flush  3 mL Intravenous Q12H   Continuous Infusions:  sodium chloride     lactated ringers 100 mL/hr at 01/07/23 0321   PRN Meds: sodium chloride, albuterol, morphine injection, senna-docusate, sodium chloride flush  Allergies:    Allergies  Allergen Reactions   Dust Mite Extract Anaphylaxis and  Rash   Egg-Derived Products Anaphylaxis   Influenza Vaccines     ALLERGY TO EGGS   Metoprolol Shortness Of Breath    Mild increase in SOB related to asthma   Vicodin [Hydrocodone-Acetaminophen] Nausea And Vomiting    Social History:   Social History   Socioeconomic History   Marital status: Married    Spouse name: Not on file   Number of children: 3   Years of education: Not on file   Highest education level: Not on file  Occupational History   Occupation: Art gallery manager  Tobacco Use   Smoking status: Former    Current packs/day: 0.00    Average packs/day: 1 pack/day for 22.0 years (22.0 ttl pk-yrs)    Types: Cigarettes    Start date: 01/27/1989    Quit date: 01/28/2011    Years since quitting: 11.9   Smokeless tobacco: Never   Tobacco comments:    1/2 - 1 PPD  Vaping Use   Vaping status: Some Days  Substance and Sexual Activity   Alcohol use: Not Currently    Comment: QUIT drinking 01/2014   Drug use: No   Sexual activity: Not on file  Other Topics Concern   Not on file  Social History Narrative   Not on file    Family History:    Family History  Problem Relation Age of Onset   Hypertension Mother    Diverticulitis Mother    Pancreatic cancer Father        pancreatic   Heart attack Father    Heart disease Father        CAD   Liver cancer Father    Heart disease Maternal Grandmother    Heart disease Paternal Grandmother    Heart attack Paternal Grandfather    Heart disease Paternal Grandfather        CAD   Autism Son    Colon cancer Neg Hx    Colon polyps Neg Hx    Esophageal cancer Neg Hx    Rectal cancer Neg Hx    Stomach cancer Neg Hx      ROS:  Please see the history of present illness.   All other ROS reviewed and negative.     Physical Exam/Data:   Vitals:   01/07/23 0158 01/07/23 0328 01/07/23 0428 01/07/23 0731  BP: 131/80 114/78 118/82 (!) 141/79  Pulse: 88 (!) 53 64 70  Resp:  16 20 18   Temp: 97.8 F (36.6 C)  98.1 F (36.7 C) 98.4  F (36.9 C)  TempSrc: Oral  Oral  Oral  SpO2:  95% 93%   Weight: 114.1 kg     Height: 6' (1.829 m)       Intake/Output Summary (Last 24 hours) at 01/07/2023 0804 Last data filed at 01/07/2023 0321 Gross per 24 hour  Intake 207.16 ml  Output --  Net 207.16 ml      01/07/2023    1:58 AM 03/23/2021    3:15 PM 12/31/2020    7:44 AM  Last 3 Weights  Weight (lbs) 251 lb 8 oz 259 lb 9.6 oz 264 lb  Weight (kg) 114.08 kg 117.754 kg 119.75 kg     Body mass index is 34.11 kg/m.  General:  Well nourished, well developed male appearing in no acute distress HEENT: normal Neck: no JVD Vascular: No carotid bruits; Distal pulses 2+ bilaterally Cardiac:  normal S1, S2; RRR; no murmur  Lungs:  clear to auscultation bilaterally, no wheezing, rhonchi or rales  Abd: soft, nontender, no hepatomegaly  Ext: no pitting edema Musculoskeletal:  No deformities, BUE and BLE strength normal and equal Skin: warm and dry  Neuro:  CNs 2-12 intact, no focal abnormalities noted Psych:  Normal affect   EKG: The EKG was personally reviewed and demonstrates: NSR, HR 98 with no acute ST abnormalities.   Telemetry:  Telemetry was personally reviewed and demonstrates: NSR with episodes of sinus bradycardia, HR in 50's to 60's.   Relevant CV Studies:  LHC: 12/2014    IMPRESSION:widely patent stents with no significant CAD and normal LV function. There are no upper vessels. I believe his chest pain is noncardiac. He the procedure was done radially. He received weight-based heparin and radial cocktail via side-arm sheath. The sheath was removed and a TR band was placed on the right wrist to achieve patent hemostasis. The patient left the lab in stable condition. He'll be discharged home this afternoon and will follow-up with me in the office in several weeks.   Laboratory Data:  High Sensitivity Troponin:   Recent Labs  Lab 01/06/23 1948 01/06/23 2145 01/07/23 0635  TROPONINIHS 6 13 28*      Chemistry Recent Labs  Lab 01/06/23 1948 01/06/23 2023 01/07/23 0226  NA 137 137 136  K 4.5 4.3 3.8  CL 103 103 104  CO2 21*  --  23  GLUCOSE 112* 98 95  BUN 10 9 8   CREATININE 1.51* 1.20 1.10  CALCIUM 8.9  --  8.7*  GFRNONAA 56*  --  >60  ANIONGAP 13  --  9    Recent Labs  Lab 01/06/23 1948 01/07/23 0226  PROT 6.3* 6.2*  ALBUMIN 3.5 3.3*  AST 20 12*  ALT 16 16  ALKPHOS 55 55  BILITOT 0.9 0.5   Lipids  Recent Labs  Lab 01/07/23 0226  CHOL 219*  TRIG 73  HDL 31*  LDLCALC 173*  CHOLHDL 7.1    Hematology Recent Labs  Lab 01/06/23 1948 01/06/23 2023 01/07/23 0226  WBC 9.7  --  15.8*  RBC 4.74  --  4.54  HGB 14.0 14.3 13.6  HCT 42.9 42.0 40.9  MCV 90.5  --  90.1  MCH 29.5  --  30.0  MCHC 32.6  --  33.3  RDW 12.8  --  13.1  PLT 278  --  255   Thyroid No results for input(s): "TSH", "FREET4" in the last 168 hours.  BNPNo results for input(s): "BNP", "PROBNP" in the last 168 hours.  DDimer No results for input(s): "DDIMER" in the last 168  hours.   Radiology/Studies:  CT HEAD WO CONTRAST ( )  Result Date: 01/07/2023 CLINICAL DATA:  Syncope/presyncope EXAM: CT HEAD WITHOUT CONTRAST TECHNIQUE: Contiguous axial images were obtained from the base of the skull through the vertex without intravenous contrast. RADIATION DOSE REDUCTION: This exam was performed according to the departmental dose-optimization program which includes automated exposure control, adjustment of the mA and/or kV according to patient size and/or use of iterative reconstruction technique. COMPARISON:  None Available. FINDINGS: Brain: No intracranial hemorrhage, mass effect, or evidence of acute infarct. No hydrocephalus. No extra-axial fluid collection. Vascular: No hyperdense vessel or unexpected calcification. Skull: No fracture or focal lesion. Sinuses/Orbits: No acute finding. Other: None. IMPRESSION: No acute intracranial abnormality. Electronically Signed   By: Minerva Fester M.D.   On:  01/07/2023 00:47   CT Angio Chest/Abd/Pel for Dissection W and/or Wo Contrast  Result Date: 01/06/2023 CLINICAL DATA:  Per EMS run sheet- chest pain, anxiety worries, dizziness and giddiness, shortness of breath EXAM: CT ANGIOGRAPHY CHEST, ABDOMEN AND PELVIS TECHNIQUE: Non-contrast CT of the chest was initially obtained. Multidetector CT imaging through the chest, abdomen and pelvis was performed using the standard protocol during bolus administration of intravenous contrast. Multiplanar reconstructed images and MIPs were obtained and reviewed to evaluate the vascular anatomy. RADIATION DOSE REDUCTION: This exam was performed according to the departmental dose-optimization program which includes automated exposure control, adjustment of the mA and/or kV according to patient size and/or use of iterative reconstruction technique. CONTRAST:  OMNIPAQUE IOHEXOL 350 MG/ML SOLN COMPARISON:  Chest radiograph 01/04/2015 FINDINGS: CTA CHEST FINDINGS Cardiovascular: Coronary artery and aortic atherosclerotic calcification. No pericardial effusion. No aortic aneurysm or dissection. Mediastinum/Nodes: No thoracic adenopathy by size. Unremarkable trachea and esophagus. Lungs/Pleura: Mild air trapping. No focal consolidation, pleural effusion, or pneumothorax. Musculoskeletal: No acute fracture. Review of the MIP images confirms the above findings. CTA ABDOMEN AND PELVIS FINDINGS VASCULAR Aorta: Advanced mixed density atherosclerotic plaque throughout the abdominal aorta without aneurysm or dissection. There is mild narrowing of the infrarenal abdominal. Celiac: Normal variant configuration of the celiac axis with the left gastric and splenic arteries arising at the aorta. No aneurysm or dissection. SMA: Patent without aneurysm or dissection. Renals: Patent without aneurysm or dissection. Accessory left renal artery. IMA: Patent. Inflow: Scattered atherosclerotic plaque without severe stenosis. No aneurysm or dissection.  Veins: No obvious venous abnormality within the limitations of this arterial phase study. Review of the MIP images confirms the above findings. NON-VASCULAR Hepatobiliary: Hepatic steatosis. Unremarkable gallbladder and biliary tree. Pancreas: Unremarkable. Spleen: Unremarkable. Adrenals/Urinary Tract: Unremarkable adrenal glands. No urinary calculi or hydronephrosis. Unremarkable bladder. Stomach/Bowel: Normal caliber large and small bowel. Sigmoid diverticulosis. There is mild wall thickening and trace adjacent stranding about the sigmoid colon. No free air or abscess. Normal appendix and stomach. Lymphatic: No enlarged lymph nodes. Reproductive: Unremarkable. Other: Small fat containing umbilical hernia. Small fat containing left inguinal hernia. Musculoskeletal: No acute fracture. Review of the MIP images confirms the above findings. IMPRESSION: 1. No aortic aneurysm or dissection. 2. Mild uncomplicated sigmoid diverticulitis. Consider follow-up colonoscopy following resolution of patient's acute symptoms to exclude underlying mass. 3. Hepatic steatosis. 4. Mild air trapping in the lungs which can be seen with constrictive bronchiolitis. Aortic Atherosclerosis (ICD10-I70.0). Electronically Signed   By: Minerva Fester M.D.   On: 01/06/2023 19:53     Assessment and Plan:   1. Chest Pain concerning for a Cardiac Etiology - His episode of pain is concerning for a cardiac etiology as it resembles  his prior angina and improved with nitroglycerin use. Enzymes have been flat at 6, 13 and 28 with repeat value pending. Echocardiogram is pending to assess for any structural abnormalities. - Discussed options for ischemic evaluation with the patient and initially recommended a cardiac catheterization for definitive evaluation given the timeframe since his last evaluation and concerning symptoms but he declines at this time unless he is found to have enzyme elevation or abnormalities on his echocardiogram given the  complications he had at the time of his last catheterization. Unable to perform a stress test today as there is no availability on the weekends and we reviewed possibly having this tomorrow but he prefers to have it this as an outpatient if able. Reviewed with Dr. Royann Shivers and will obtain an echocardiogram and repeat Troponin this AM and if reassuring, can plan for an outpatient Myoview. If abnormal, the patient is in agreement with staying for additional work-up - Continue ASA 81mg  daily, Bisoprolol 2.5mg  daily and Imdur 15mg  daily. Will switch Atorvastatin to Crestor 20mg  daily given prior myalgias. Can likely titrate Imdur to 30 mg daily. Would avoid titration of beta-blocker therapy given his baseline heart rate in the 50's to 60's.  2. CAD - He is s/p NSTEMI in 09/2010 with BMS to LCx, cath in 01/2014 with DES to mid-RCA and most recent cath in 12/2014 showed patent stents but complicated by compartment syndrome of RUE. - Echo pending. Would plan for repeat ischemic evaluation as discussed above. He has been restarted on ASA 81mg  daily while being continued on Bisoprolol 2.5mg  daily and Imdur 15mg  daily. Will switch Atorvastatin to Crestor 20mg  daily given myalgias with Atorvastatin in the past.   3. Presyncope - Likely due to hypotension in the setting of nitroglycerin use and episode at home possibly due to pain at that time. He denies any recurrent symptoms at this time. Telemetry shows no significant arrhythmias. Head CT was ordered by the admitting team with no acute intracranial abnormalities. No plans for a monitor at this time.   4. HTN - BP has been significantly variable over the past day as he reports his BP was in the 190's when checked at home but he developed significant hypotension following administration of nitroglycerin with SBP in the 60's and presyncope at that time. BP at 141/79 on most recent check. He has been continued on Bisoprolol 2.5 mg daily and Imdur 15 mg daily. Would  not further titrate Bisoprolol given his heart rate in the 50's to 60's overnight.  5. HLD - FLP shows his LDL is elevated to 173 and he has been off statin therapy for over a year due to myalgias with Atorvastatin. Reports this was the only statin he tried in the past. Will switch to Crestor 20 mg daily and plan for repeat FLP and LFT's in 6 to 8 weeks. We did briefly review PCSK9 inhibitor therapy in case he is intolerant to statins or LDL remains above goal.  6. Abnormal CT - CT this admission did show mild, uncomplicated sigmoid diverticulitis but he has not been started on antibiotics yet given no recent symptoms. Of note, his WBC is elevated 15.8 this morning. Would defer initiation of antibiotics to the admitting team.   Risk Assessment/Risk Scores:     TIMI Risk Score for Unstable Angina or Non-ST Elevation MI:   The patient's TIMI risk score is 4, which indicates a 20% risk of all cause mortality, new or recurrent myocardial infarction or need for urgent revascularization in  the next 14 days.{   For questions or updates, please contact [facility] Please consult [email or web address] for contact info under    Signed, Ellsworth Lennox, PA-C  01/07/2023 8:04 AM  I have seen and examined the patient along with Ellsworth Lennox, PA-C .  I have reviewed the chart, notes and new data.  I agree with PA/NP's note.  Key new complaints: He had symptoms strongly reminiscent of his previous angina and fairly severe, but brief in duration. Key examination changes: Obese, normal cardiovascular exam. Key new findings / data: Marginal, adynamic increase in high-sensitivity troponin I.  Low risk ECG findings.  Echocardiogram shows normal left ventricular systolic and diastolic function of does not show any regional wall motion abnormalities.  PLAN: It is quite possible that he experienced an episode of unstable angina, but he has low risk findings clinically, biochemically and by ECG.  He  wants to avoid cardiac catheterization unless absolutely necessary due to his previous complications with compartment syndrome.  Will plan outpatient nuclear perfusion imaging (prefer Lexiscan Myoview due to the possibility of acute coronary syndrome/ruptured plaque).  If this study is abnormal, he understands that he will require cardiac catheterization and he will prefer to have that done from a femoral rather than radial approach.  He understands that he should return to the hospital promptly if between now and the stress test he has recurrent chest discomfort.  Continue aspirin, statin, beta-blocker.  Informed Consent   Shared Decision Making/Informed Consent The risks [chest pain, shortness of breath, cardiac arrhythmias, dizziness, blood pressure fluctuations, myocardial infarction, stroke/transient ischemic attack, nausea, vomiting, allergic reaction, radiation exposure, metallic taste sensation and life-threatening complications (estimated to be 1 in 10,000)], benefits (risk stratification, diagnosing coronary artery disease, treatment guidance) and alternatives of a nuclear stress test were discussed in detail with Mr. Badami and he agrees to proceed.      Thurmon Fair, MD, [facility] [phone] 01/07/2023, 12:44 PM

## 2023-01-08 LAB — HEMOGLOBIN A1C
Hgb A1c MFr Bld: 5.9 % — ABNORMAL HIGH (ref 4.8–5.6)
Mean Plasma Glucose: 123 mg/dL

## 2023-01-09 ENCOUNTER — Telehealth: Payer: Self-pay | Admitting: Emergency Medicine

## 2023-01-09 NOTE — Telephone Encounter (Signed)
Called patient and went over pre-procedure instructions. He verbalized understanding ----- Message ----- From: Ellsworth Lennox, PA-C Sent: 01/07/2023   1:19 PM EDT To: [facility] Scheduling; [facility] Triage Subject: Stress Test this Week                          Good afternoon,  This patient needs a Lexiscan Myoview this week if able per Dr. Royann Shivers. Orders are in and instructions were included in his AVS but would recommend reviewing the NPO part just to be sure when calling him.  Thanks, [place] [facility].  Please arrive 15 minutes prior to your appointment time for registration and insurance purposes.   The test will take approximately 3 to 4 hours to complete; you may bring reading material.  If someone comes with you to your appointment, they will need to remain in the main lobby due to limited space in the testing area. **If you are pregnant or breastfeeding, please notify the nuclear lab prior to your appointment**   How to prepare for your Myocardial Perfusion Test: Do not eat or drink 3 hours prior to your test, except you may have water. Do not consume products containing caffeine (regular or decaffeinated) 12 hours prior to your test. (ex: coffee, chocolate, sodas, tea). Do wear comfortable clothes (no dresses or overalls) and walking shoes, tennis shoes preferred (No heels or open toe shoes are allowed). Do NOT wear cologne, perfume, aftershave, or lotions (deodorant is allowed). If you use an inhaler, use it the AM of your test and bring it with you.  If you use a nebulizer, use it the AM of your test.  If these instructions are not followed, your test will have to be rescheduled.

## 2023-01-11 ENCOUNTER — Telehealth (HOSPITAL_COMMUNITY): Payer: Self-pay

## 2023-01-11 NOTE — Telephone Encounter (Signed)
Spoke with the patient, detailed instructions given. Asked to call back with any questions. S.Euan Wandler CCT

## 2023-01-16 ENCOUNTER — Ambulatory Visit (HOSPITAL_COMMUNITY): Payer: Self-pay | Attending: Cardiovascular Disease

## 2023-01-16 DIAGNOSIS — Z955 Presence of coronary angioplasty implant and graft: Secondary | ICD-10-CM | POA: Insufficient documentation

## 2023-01-16 DIAGNOSIS — R079 Chest pain, unspecified: Secondary | ICD-10-CM | POA: Insufficient documentation

## 2023-01-16 LAB — MYOCARDIAL PERFUSION IMAGING
LV dias vol: 122 mL (ref 62–150)
LV sys vol: 62 mL
Nuc Stress EF: 49 %
Peak HR: 94 {beats}/min
Rest HR: 55 {beats}/min
Rest Nuclear Isotope Dose: 10.3 mCi
SDS: 3
SRS: 0
SSS: 3
ST Depression (mm): 0 mm
Stress Nuclear Isotope Dose: 32.1 mCi
TID: 1.09

## 2023-01-16 MED ORDER — TECHNETIUM TC 99M TETROFOSMIN IV KIT
10.3000 | PACK | Freq: Once | INTRAVENOUS | Status: AC | PRN
  Administered 2023-01-16: 10.3 via INTRAVENOUS

## 2023-01-16 MED ORDER — TECHNETIUM TC 99M TETROFOSMIN IV KIT
32.1000 | PACK | Freq: Once | INTRAVENOUS | Status: AC | PRN
  Administered 2023-01-16: 32.1 via INTRAVENOUS

## 2023-01-16 MED ORDER — REGADENOSON 0.4 MG/5ML IV SOLN
0.4000 mg | Freq: Once | INTRAVENOUS | Status: AC
  Administered 2023-01-16: 0.4 mg via INTRAVENOUS

## 2023-01-19 ENCOUNTER — Ambulatory Visit: Payer: Self-pay | Admitting: Physician Assistant
